# Patient Record
Sex: Male | Born: 1960 | Race: White | Hispanic: No | Marital: Married | Smoking: Never smoker
Health system: Southern US, Community
[De-identification: ages and names within clinical notes are randomized; demographics above are authoritative.]

## PROBLEM LIST (undated history)

## (undated) DIAGNOSIS — E785 Hyperlipidemia, unspecified: Secondary | ICD-10-CM

## (undated) DIAGNOSIS — H269 Unspecified cataract: Secondary | ICD-10-CM

## (undated) DIAGNOSIS — H3552 Pigmentary retinal dystrophy: Secondary | ICD-10-CM

## (undated) DIAGNOSIS — K5790 Diverticulosis of intestine, part unspecified, without perforation or abscess without bleeding: Secondary | ICD-10-CM

## (undated) DIAGNOSIS — J45909 Unspecified asthma, uncomplicated: Secondary | ICD-10-CM

## (undated) DIAGNOSIS — Z87442 Personal history of urinary calculi: Secondary | ICD-10-CM

## (undated) HISTORY — PX: CATARACT EXTRACTION: SUR2

## (undated) HISTORY — DX: Diverticulosis of intestine, part unspecified, without perforation or abscess without bleeding: K57.90

## (undated) HISTORY — PX: CLOSED MANIPULATION SHOULDER: SUR205

## (undated) HISTORY — DX: Pigmentary retinal dystrophy: H35.52

## (undated) HISTORY — DX: Hyperlipidemia, unspecified: E78.5

## (undated) HISTORY — PX: CHOLECYSTECTOMY: SHX55

## (undated) HISTORY — DX: Unspecified asthma, uncomplicated: J45.909

## (undated) HISTORY — DX: Unspecified cataract: H26.9

## (undated) HISTORY — PX: KNEE ARTHROSCOPY: SUR90

---

## 2019-08-24 ENCOUNTER — Encounter: Payer: Self-pay | Admitting: Gastroenterology

## 2019-09-25 ENCOUNTER — Ambulatory Visit (AMBULATORY_SURGERY_CENTER): Payer: Self-pay | Admitting: *Deleted

## 2019-09-25 ENCOUNTER — Other Ambulatory Visit: Payer: Self-pay

## 2019-09-25 VITALS — Temp 98.0°F | Ht 69.5 in | Wt 251.0 lb

## 2019-09-25 DIAGNOSIS — Z01818 Encounter for other preprocedural examination: Secondary | ICD-10-CM

## 2019-09-25 DIAGNOSIS — Z1211 Encounter for screening for malignant neoplasm of colon: Secondary | ICD-10-CM

## 2019-09-25 MED ORDER — NA SULFATE-K SULFATE-MG SULF 17.5-3.13-1.6 GM/177ML PO SOLN: 1.0000 | Freq: Once | ORAL | 0 refills | Status: AC

## 2019-09-25 NOTE — Progress Notes (Signed)
Pts wife in [place].  Temp 97.5  No egg or soy allergy known to patient  No issues with past sedation with any surgeries  or procedures, no intubation problems  No diet pills per patient No home 02 use per patient  No blood thinners per patient  Pt denies issues with constipation  No A fib or A flutter  EMMI video sent to pt's e mail   Due to the COVID-19 pandemic we are asking patients to follow these guidelines. Please only bring one care partner. Please be aware that your care partner may wait in the car in the parking lot or if they feel like they will be too hot to wait in the car, they may wait in the lobby on the 4th floor. All care partners are required to wear a mask the entire time (we do not have any that we can provide them), they need to practice social distancing, and we will do a Covid check for all patient's and care partners when you arrive. Also we will check their temperature and your temperature. If the care partner waits in their car they need to stay in the parking lot the entire time and we will call them on their cell phone when the patient is ready for discharge so they can bring the car to the front of the building. Also all patient's will need to wear a mask into building.

## 2019-10-05 ENCOUNTER — Other Ambulatory Visit (HOSPITAL_COMMUNITY)
Admission: RE | Admit: 2019-10-05 | Discharge: 2019-10-05 | Disposition: A | Discharge status: Home / Self Care | Payer: Medicare PPO | Source: Ambulatory Visit | Attending: Gastroenterology | Admitting: Gastroenterology

## 2019-10-05 ENCOUNTER — Other Ambulatory Visit: Payer: Self-pay

## 2019-10-05 DIAGNOSIS — Z20822 Contact with and (suspected) exposure to covid-19: Secondary | ICD-10-CM | POA: Diagnosis not present

## 2019-10-05 DIAGNOSIS — Z01812 Encounter for preprocedural laboratory examination: Secondary | ICD-10-CM | POA: Insufficient documentation

## 2019-10-06 LAB — SARS CORONAVIRUS 2 (TAT 6-24 HRS): SARS Coronavirus 2: NEGATIVE

## 2019-10-09 ENCOUNTER — Ambulatory Visit (AMBULATORY_SURGERY_CENTER): Payer: Medicare PPO | Admitting: Gastroenterology

## 2019-10-09 ENCOUNTER — Encounter: Payer: Self-pay | Admitting: Gastroenterology

## 2019-10-09 ENCOUNTER — Other Ambulatory Visit: Payer: Self-pay

## 2019-10-09 VITALS — BP 99/66 | HR 75 | Temp 96.9°F | Resp 19 | Ht 69.5 in | Wt 251.0 lb

## 2019-10-09 DIAGNOSIS — D123 Benign neoplasm of transverse colon: Secondary | ICD-10-CM

## 2019-10-09 DIAGNOSIS — Z1211 Encounter for screening for malignant neoplasm of colon: Secondary | ICD-10-CM

## 2019-10-09 HISTORY — PX: COLONOSCOPY: SHX174

## 2019-10-09 MED ORDER — SODIUM CHLORIDE 0.9 % IV SOLN: 500.0000 mL | Freq: Once | INTRAVENOUS | Status: DC

## 2019-10-09 NOTE — Op Note (Addendum)
[place] Patient Name: Austin Frost Procedure Date: 10/09/2019 9:16 AM MRN: [id number] Endoscopist: Ladene Artist , MD Age: 59 Referring MD:  Date of Birth: Sep 27, 1960 Gender: Male Account #: [id number] Procedure:                Colonoscopy Indications:              Screening for colorectal malignant neoplasm Medicines:                Monitored Anesthesia Care Procedure:                Pre-Anesthesia Assessment:                           - Prior to the procedure, a History and Physical                            was performed, and patient medications and                            allergies were reviewed. The patient's tolerance of                            previous anesthesia was also reviewed. The risks                            and benefits of the procedure and the sedation                            options and risks were discussed with the patient.                            All questions were answered, and informed consent                            was obtained. Prior Anticoagulants: The patient has                            taken no previous anticoagulant or antiplatelet                            agents. ASA Grade Assessment: II - A patient with                            mild systemic disease. After reviewing the risks                            and benefits, the patient was deemed in                            satisfactory condition to undergo the procedure.                           After obtaining informed consent, the colonoscope  was passed under direct vision. Throughout the                            procedure, the patient's blood pressure, pulse, and                            oxygen saturations were monitored continuously. The                            Colonoscope was introduced through the anus and                            advanced to the the cecum, identified by                            appendiceal orifice and  ileocecal valve. The                            ileocecal valve, appendiceal orifice, and rectum                            were photographed. The quality of the bowel                            preparation was good. The patient tolerated the                            procedure well. The colonoscopy was somewhat                            difficult due to multiple diverticula, spasm and                            stricture in the colon. Successful completion of                            the procedure was aided by withdrawing the adult                            colonoscope and replacing with the pediatric                            colonoscope. Scope In: 9:34:28 AM Scope Out: 9:56:55 AM Scope Withdrawal Time: 0 hours 18 minutes 41 seconds  Total Procedure Duration: 0 hours 22 minutes 27 seconds  Findings:                 The perianal and digital rectal examinations were                            normal.                           A 6 mm polyp was found in the transverse colon. The  polyp was sessile. The polyp was removed with a                            cold snare. Resection and retrieval were complete.                           Multiple medium-mouthed diverticula were found in                            the left colon. There was narrowing and stricturing                            of the colon in association with the diverticular                            opening. There was evidence of diverticular spasm                            and patchy erythema. There was no evidence of                            diverticular bleeding.                           Internal hemorrhoids were found during                            retroflexion. The hemorrhoids were small and Grade                            I (internal hemorrhoids that do not prolapse).                           The exam was otherwise without abnormality on                            direct and  retroflexion views. Complications:            No immediate complications. Estimated blood loss:                            None. Estimated Blood Loss:     Estimated blood loss: none. Impression:               - One 6 mm polyp in the transverse colon, removed                            with a cold snare. Resected and retrieved.                           - Severe diverticulosis in the left colon. There                            was narrowing and stricture of the colon in  association with the diverticular opening. There                            was evidence of diverticular spasm and erythema.                            There was no evidence of diverticular bleeding.                           - Internal hemorrhoids.                           - The examination was otherwise normal on direct                            and retroflexion views. Recommendation:           - Repeat colonoscopy after studies are complete for                            surveillance based on pathology results.                           - Patient has a contact number available for                            emergencies. The signs and symptoms of potential                            delayed complications were discussed with the                            patient. Return to normal activities tomorrow.                            Written discharge instructions were provided to the                            patient.                           - High fiber diet wtih adequate daily water intake.                           - Continue present medications.                           - Await pathology results. Ladene Artist, MD 10/09/2019 10:03:05 AM This report has been signed electronically.

## 2019-10-09 NOTE — Progress Notes (Signed)
Called to room to assist during endoscopic procedure.  Patient ID and intended procedure confirmed with present staff. Received instructions for my participation in the procedure from the performing physician.  

## 2019-10-09 NOTE — Progress Notes (Signed)
pt tolerated well. VSS. awake and to recovery. Report given to RN.  

## 2019-10-09 NOTE — Patient Instructions (Signed)
Handouts provided on polyps, diverticulosis, hemorrhoids and high-fiber diet.   Recommend a High-fiber diet with adequate daily water intake.   YOU HAD AN ENDOSCOPIC PROCEDURE TODAY AT [facility]:   Refer to the procedure report that was given to you for any specific questions about what was found during the examination.  If the procedure report does not answer your questions, please call your gastroenterologist to clarify.  If you requested that your care partner not be given the details of your procedure findings, then the procedure report has been included in a sealed envelope for you to review at your convenience later.  YOU SHOULD EXPECT: Some feelings of bloating in the abdomen. Passage of more gas than usual.  Walking can help get rid of the air that was put into your GI tract during the procedure and reduce the bloating. If you had a lower endoscopy (such as a colonoscopy or flexible sigmoidoscopy) you may notice spotting of blood in your stool or on the toilet paper. If you underwent a bowel prep for your procedure, you may not have a normal bowel movement for a few days.  Please Note:  You might notice some irritation and congestion in your nose or some drainage.  This is from the oxygen used during your procedure.  There is no need for concern and it should clear up in a day or so.  SYMPTOMS TO REPORT IMMEDIATELY:   Following lower endoscopy (colonoscopy or flexible sigmoidoscopy):  Excessive amounts of blood in the stool  Significant tenderness or worsening of abdominal pains  Swelling of the abdomen that is new, acute  Fever of 100F or higher   For urgent or emergent issues, a gastroenterologist can be reached at any hour by calling [phone]. Do not use MyChart messaging for urgent concerns.    DIET:  We do recommend a small meal at first, but then you may proceed to your regular diet.  Drink plenty of fluids but you should avoid alcoholic beverages for  24 hours.  ACTIVITY:  You should plan to take it easy for the rest of today and you should NOT DRIVE or use heavy machinery until tomorrow (because of the sedation medicines used during the test).    FOLLOW UP: Our staff will call the number listed on your records 48-72 hours following your procedure to check on you and address any questions or concerns that you may have regarding the information given to you following your procedure. If we do not reach you, we will leave a message.  We will attempt to reach you two times.  During this call, we will ask if you have developed any symptoms of COVID 19. If you develop any symptoms (ie: fever, flu-like symptoms, shortness of breath, cough etc.) before then, please call [phone].  If you test positive for Covid 19 in the 2 weeks post procedure, please call and report this information to [place].    If any biopsies were taken you will be contacted by phone or by letter within the next 1-3 weeks.  Please call us at [phone] if you have not heard about the biopsies in 3 weeks.    SIGNATURES/CONFIDENTIALITY: You and/or your care partner have signed paperwork which will be entered into your electronic medical record.  These signatures attest to the fact that that the information above on your After Visit Summary has been reviewed and is understood.  Full responsibility of the confidentiality of this discharge information lies with  you and/or your care-partner.

## 2019-10-09 NOTE — Progress Notes (Signed)
Vitals-cw Temp-JB  Pt's states no medical or surgical changes since previsit or office visit.

## 2019-10-11 ENCOUNTER — Telehealth: Payer: Self-pay

## 2019-10-11 NOTE — Telephone Encounter (Signed)
  Follow up Call-  Call back number 10/09/2019  Post procedure Call Back phone  # [phone]  Permission to leave phone message Yes     Patient questions:  Do you have a fever, pain , or abdominal swelling? No. Pain Score  0 *  Have you tolerated food without any problems? Yes.    Have you been able to return to your normal activities? Yes.    Do you have any questions about your discharge instructions: Diet   No. Medications  No. Follow up visit  No.  Do you have questions or concerns about your Care? No.  Actions: * If pain score is 4 or above: No action needed, pain <4.  1. Have you developed a fever since your procedure? no  2.   Have you had an respiratory symptoms (SOB or cough) since your procedure? no  3.   Have you tested positive for COVID 19 since your procedure no  4.   Have you had any family members/close contacts diagnosed with the COVID 19 since your procedure?  no   If yes to any of these questions please route to Joylene John, RN and Erenest Rasher, RN

## 2019-10-17 ENCOUNTER — Encounter: Payer: Self-pay | Admitting: Gastroenterology

## 2019-11-15 ENCOUNTER — Ambulatory Visit: Payer: Medicare PPO | Admitting: Urology

## 2019-11-22 ENCOUNTER — Ambulatory Visit: Payer: Medicare PPO | Admitting: Urology

## 2019-12-20 ENCOUNTER — Other Ambulatory Visit: Payer: Self-pay | Admitting: Urology

## 2019-12-20 ENCOUNTER — Ambulatory Visit: Payer: Medicare PPO | Admitting: Urology

## 2019-12-20 ENCOUNTER — Ambulatory Visit (INDEPENDENT_AMBULATORY_CARE_PROVIDER_SITE_OTHER): Payer: Medicare PPO | Admitting: Urology

## 2019-12-20 ENCOUNTER — Encounter: Payer: Self-pay | Admitting: Urology

## 2019-12-20 ENCOUNTER — Other Ambulatory Visit: Payer: Self-pay

## 2019-12-20 VITALS — BP 142/87 | HR 86 | Temp 97.0°F | Ht 70.0 in | Wt 250.0 lb

## 2019-12-20 DIAGNOSIS — R319 Hematuria, unspecified: Secondary | ICD-10-CM

## 2019-12-20 LAB — POCT URINALYSIS DIPSTICK
Glucose, UA: NEGATIVE
Ketones, UA: NEGATIVE
Leukocytes, UA: NEGATIVE
Nitrite, UA: NEGATIVE
Protein, UA: NEGATIVE
Spec Grav, UA: >=1.03 — AB (ref 1.010–1.025)
Urobilinogen, UA: NEGATIVE E.U./dL — AB
pH, UA: 5 (ref 5.0–8.0)

## 2019-12-20 LAB — BASIC METABOLIC PANEL
BUN: 14 mg/dL (ref 7–25)
CO2: 29 mmol/L (ref 20–32)
Calcium: 9.8 mg/dL (ref 8.6–10.3)
Chloride: 101 mmol/L (ref 98–110)
Creat: 0.79 mg/dL (ref 0.70–1.33)
Glucose, Bld: 103 mg/dL (ref 65–139)
Potassium: 4.6 mmol/L (ref 3.5–5.3)
Sodium: 138 mmol/L (ref 135–146)

## 2019-12-20 NOTE — Progress Notes (Signed)
12/20/2019 9:10 AM   Austin Frost July 13, 1960 [id number]  Referring provider: Josem Kaufmann, MD [address] [place],  [place] [address]  Microhematuria  HPI: Austin Frost is a 59yo here for evaluation of microhematuria. UA today shows trace blood. He has had multiple UAs which showed microscopic blood. No hx of nephrolithiasis but he thinks he passed one last week. He was having right flank pain and dysuria which have since resolved. No tobacco abuse hx. 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 No significant LUTS. No prior UTIs. No blood thinners.    PMH: Past Medical History:  Diagnosis Date  . Asthma    as a kid  . Cataract   . Diverticulosis   . Hyperlipidemia   . Retinitis pigmentosa     Surgical History: Past Surgical History:  Procedure Laterality Date  . CATARACT EXTRACTION    . CHOLECYSTECTOMY    . CLOSED MANIPULATION SHOULDER Left   . COLONOSCOPY  10/09/2019  . KNEE ARTHROSCOPY Left     Home Medications:  Allergies as of 12/20/2019   No Known Allergies     Medication List       Accurate as of December 20, 2019  9:10 AM. If you have any questions, ask your nurse or doctor.        Align 4 MG Caps Take by mouth.   rosuvastatin 10 MG tablet Commonly known as: CRESTOR Take 10 mg by mouth daily.   VITAMIN A PO Take by mouth.       Allergies: No Known Allergies  Family History: Family History  Problem Relation Age of Onset  . Colon cancer Father 27  . Esophageal cancer Neg Hx   . Stomach cancer Neg Hx   . Rectal cancer Neg Hx     Social History:  reports that he has never smoked. He has never used smokeless tobacco. He reports that he does not drink alcohol and does not use drugs.  ROS: All other review of systems were reviewed and are negative except what is noted above in HPI  Physical Exam: BP (!) 142/87   Pulse 86   Temp (!) 97 F (36.1 C)   Ht 5\' 10"  (1.778 m)   Wt 250 lb (113.4 kg)   BMI 35.87 kg/m   Constitutional:  Alert and  oriented, No acute distress. HEENT: [place] AT, moist mucus membranes.  Trachea midline, no masses. Cardiovascular: No clubbing, cyanosis, or edema. Respiratory: Normal respiratory effort, no increased work of breathing. GI: Abdomen is soft, nontender, nondistended, no abdominal masses GU: No CVA tenderness. Circumcised phallus. No masses/lesions on penis, testis, scrotum. Prostate 40g smooth no nodules no induration.  Lymph: No cervical or inguinal lymphadenopathy. Skin: No rashes, bruises or suspicious lesions. Neurologic: Grossly intact, no focal deficits, moving all 4 extremities. Psychiatric: Normal mood and affect.  Laboratory Data: No results found for: WBC, HGB, HCT, MCV, PLT  No results found for: CREATININE  No results found for: PSA  No results found for: TESTOSTERONE  No results found for: HGBA1C  Urinalysis    Component Value Date/Time   BILIRUBINUR small 12/20/2019 0852   PROTEINUR Negative 12/20/2019 0852   UROBILINOGEN negative (A) 12/20/2019 0852   NITRITE neg 12/20/2019 0852   LEUKOCYTESUR Negative 12/20/2019 0852    No results found for: LABMICR, [place], RBCUA, LABEPIT, MUCUS, BACTERIA  Pertinent Imaging:  No results found for this or any previous visit.  No results found for this or any previous visit.  No results  found for this or any previous visit.  No results found for this or any previous visit.  No results found for this or any previous visit.  No results found for this or any previous visit.  No results found for this or any previous visit.  No results found for this or any previous visit.   Assessment & Plan:    1. Hematuria, microhematuria -BMP -CT hematuria -off cystoscopy - POCT urinalysis dipstick   No follow-ups on file.  Nicolette Bang, MD  [facility]

## 2019-12-20 NOTE — Progress Notes (Signed)
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

## 2019-12-20 NOTE — Patient Instructions (Signed)
Hematuria, Adult Hematuria is blood in the urine. Blood may be visible in the urine, or it may be identified with a test. This condition can be caused by infections of the bladder, urethra, kidney, or prostate. Other possible causes include:  Kidney stones.  Cancer of the urinary tract.  Too much calcium in the urine.  Conditions that are passed from parent to child (inherited conditions).  Exercise that requires a lot of energy. Infections can usually be treated with medicine, and a kidney stone usually will pass through your urine. If neither of these is the cause of your hematuria, more tests may be needed to identify the cause of your symptoms. It is very important to tell your health care provider about any blood in your urine, even if it is painless or the blood stops without treatment. Blood in the urine, when it happens and then stops and then happens again, can be a symptom of a very serious condition, including cancer. There is no pain in the initial stages of many urinary cancers. Follow these instructions at home: Medicines  Take over-the-counter and prescription medicines only as told by your health care provider.  If you were prescribed an antibiotic medicine, take it as told by your health care provider. Do not stop taking the antibiotic even if you start to feel better. Eating and drinking  Drink enough fluid to keep your urine clear or pale yellow. It is recommended that you drink 3-4 quarts (2.8-3.8 L) a day. If you have been diagnosed with an infection, it is recommended that you drink cranberry juice in addition to large amounts of water.  Avoid caffeine, tea, and carbonated beverages. These tend to irritate the bladder.  Avoid alcohol because it may irritate the prostate (men). General instructions  If you have been diagnosed with a kidney stone, follow your health care provider's instructions about straining your urine to catch the stone.  Empty your bladder  often. Avoid holding urine for long periods of time.  If you are male: ? After a bowel movement, wipe from front to back and use each piece of toilet paper only once. ? Empty your bladder before and after sex.  Pay attention to any changes in your symptoms. Tell your health care provider about any changes or any new symptoms.  It is your responsibility to get your test results. Ask your health care provider, or the department performing the test, when your results will be ready.  Keep all follow-up visits as told by your health care provider. This is important. Contact a health care provider if:  You develop back pain.  You have a fever.  You have nausea or vomiting.  Your symptoms do not improve after 3 days.  Your symptoms get worse. Get help right away if:  You develop severe vomiting and are unable take medicine without vomiting.  You develop severe pain in your back or abdomen even though you are taking medicine.  You pass a large amount of blood in your urine.  You pass blood clots in your urine.  You feel very weak or like you might faint.  You faint. Summary  Hematuria is blood in the urine. It has many possible causes.  It is very important that you tell your health care provider about any blood in your urine, even if it is painless or the blood stops without treatment.  Take over-the-counter and prescription medicines only as told by your health care provider.  Drink enough fluid to keep   your urine clear or pale yellow. This information is not intended to replace advice given to you by your health care provider. Make sure you discuss any questions you have with your health care provider. Document Revised: 11/02/2018 Document Reviewed: 07/11/2016 Elsevier Patient Education  2020 [employer] Inc[employer]  

## 2019-12-21 ENCOUNTER — Encounter: Payer: Self-pay | Admitting: Urology

## 2019-12-29 NOTE — Progress Notes (Signed)
Results sent via my chart 

## 2020-01-12 ENCOUNTER — Other Ambulatory Visit: Payer: Self-pay

## 2020-01-12 ENCOUNTER — Encounter (HOSPITAL_COMMUNITY): Payer: Self-pay

## 2020-01-12 ENCOUNTER — Ambulatory Visit (HOSPITAL_COMMUNITY)
Admission: RE | Admit: 2020-01-12 | Discharge: 2020-01-12 | Disposition: A | Discharge status: Home / Self Care | Payer: Medicare PPO | Source: Ambulatory Visit | Attending: Urology | Admitting: Urology

## 2020-01-12 DIAGNOSIS — R319 Hematuria, unspecified: Secondary | ICD-10-CM | POA: Diagnosis not present

## 2020-01-12 MED ORDER — IOHEXOL 300 MG/ML  SOLN
125.0000 mL | Freq: Once | INTRAMUSCULAR | Status: AC | PRN
  Administered 2020-01-12: 125 mL via INTRAVENOUS

## 2020-01-17 ENCOUNTER — Ambulatory Visit (INDEPENDENT_AMBULATORY_CARE_PROVIDER_SITE_OTHER): Payer: Medicare PPO | Admitting: Urology

## 2020-01-17 ENCOUNTER — Other Ambulatory Visit: Payer: Self-pay

## 2020-01-17 ENCOUNTER — Encounter: Payer: Self-pay | Admitting: Urology

## 2020-01-17 VITALS — BP 139/83 | HR 83 | Temp 99.0°F | Ht 70.0 in | Wt 250.0 lb

## 2020-01-17 DIAGNOSIS — N200 Calculus of kidney: Secondary | ICD-10-CM

## 2020-01-17 DIAGNOSIS — R319 Hematuria, unspecified: Secondary | ICD-10-CM | POA: Diagnosis not present

## 2020-01-17 MED ORDER — CIPROFLOXACIN HCL 500 MG PO TABS
500.0000 mg | ORAL_TABLET | Freq: Once | ORAL | Status: AC
  Administered 2020-01-17: 500 mg via ORAL

## 2020-01-17 NOTE — H&P (View-Only) (Signed)
   01/17/20  CC: microhematuria  HPI: Mr Younge is a 59yo here for followup for microhematuria. CT showeda  31mm left mid pole calculus. It also showed evidence of chronic diverticulitis and patient is currently being seen by GI in [place].   Blood pressure (!) 139/83, pulse 83, temperature 99 F (37.2 C), height 5\' 10"  (1.778 m), weight (!) 250 lb (113.4 kg). NED. A&Ox3.   No respiratory distress   Abd soft, NT, ND Normal phallus with bilateral descended testicles  Cystoscopy Procedure Note  Patient identification was confirmed, informed consent was obtained, and patient was prepped using Betadine solution.  Lidocaine jelly was administered per urethral meatus.     Pre-Procedure: - Inspection reveals a normal caliber ureteral meatus.  Procedure: The flexible cystoscope was introduced without difficulty - No urethral strictures/lesions are present. - Enlarged prostate  - Normal bladder neck - Bilateral ureteral orifices identified - Bladder mucosa  reveals no ulcers, tumors, or lesions - No bladder stones - No trabeculation  Retroflexion shows 1cm intravesical prostatic protrusion   Post-Procedure: - Patient tolerated the procedure well  Assessment/ Plan: 1. Left nephrolithiasis -We discussed the management of kidney stones. These options include observation, ureteroscopy, shockwave lithotripsy (ESWL) and percutaneous nephrolithotomy (PCNL). We discussed which options are relevant to the patient's stone(s). We discussed the natural history of kidney stones as well as the complications of untreated stones and the impact on quality of life without treatment as well as with each of the above listed treatments. We also discussed the efficacy of each treatment in its ability to clear the stone burden. With any of these management options I discussed the signs and symptoms of infection and the need for emergent treatment should these be experienced. For each option we discussed the  ability of each procedure to clear the patient of their stone burden.   For observation I described the risks which include but are not limited to silent renal damage, life-threatening infection, need for emergent surgery, failure to pass stone and pain.   For ureteroscopy I described the risks which include bleeding, infection, damage to contiguous structures, positioning injury, ureteral stricture, ureteral avulsion, ureteral injury, need for prolonged ureteral stent, inability to perform ureteroscopy, need for an interval procedure, inability to clear stone burden, stent discomfort/pain, heart attack, stroke, pulmonary embolus and the inherent risks with general anesthesia.   For shockwave lithotripsy I described the risks which include arrhythmia, kidney contusion, kidney hemorrhage, need for transfusion, pain, inability to adequately break up stone, inability to pass stone fragments, [place], infection associated with obstructing stones, need for alternate surgical procedure, need for repeat shockwave lithotripsy, MI, CVA, PE and the inherent risks with anesthesia/conscious sedation.   For PCNL I described the risks including positioning injury, pneumothorax, hydrothorax, need for chest tube, inability to clear stone burden, renal laceration, arterial venous fistula or malformation, need for embolization of kidney, loss of kidney or renal function, need for repeat procedure, need for prolonged nephrostomy tube, ureteral avulsion, MI, CVA, PE and the inherent risks of general anesthesia.   - The patient would like to proceed with ESWL  No follow-ups on file.  Nicolette Bang, MD

## 2020-01-17 NOTE — Patient Instructions (Signed)
Dietary Guidelines to Help Prevent Kidney Stones Kidney stones are deposits of minerals and salts that form inside your kidneys. Your risk of developing kidney stones may be greater depending on your diet, your lifestyle, the medicines you take, and whether you have certain medical conditions. Most people can reduce their chances of developing kidney stones by following the instructions below. Depending on your overall health and the type of kidney stones you tend to develop, your dietitian may give you more specific instructions. What are tips for following this plan? Reading food labels  Choose foods with "no salt added" or "low-salt" labels. Limit your sodium intake to less than 1500 mg per day.  Choose foods with calcium for each meal and snack. Try to eat about 300 mg of calcium at each meal. Foods that contain 200-500 mg of calcium per serving include: ? 8 oz (237 ml) of milk, fortified nondairy milk, and fortified fruit juice. ? 8 oz (237 ml) of kefir, yogurt, and soy yogurt. ? 4 oz (118 ml) of tofu. ? 1 oz of cheese. ? 1 cup (300 g) of dried figs. ? 1 cup (91 g) of cooked broccoli. ? 1-3 oz can of sardines or mackerel.  Most people need 1000 to 1500 mg of calcium each day. Talk to your dietitian about how much calcium is recommended for you. Shopping  Buy plenty of fresh fruits and vegetables. Most people do not need to avoid fruits and vegetables, even if they contain nutrients that may contribute to kidney stones.  When shopping for convenience foods, choose: ? Whole pieces of fruit. ? Premade salads with dressing on the side. ? Low-fat fruit and yogurt smoothies.  Avoid buying frozen meals or prepared deli foods.  Look for foods with live cultures, such as yogurt and kefir. Cooking  Do not add salt to food when cooking. Place a salt shaker on the table and allow each person to add his or her own salt to taste.  Use vegetable protein, such as beans, textured vegetable  protein (TVP), or tofu instead of meat in pasta, casseroles, and soups. Meal planning   Eat less salt, if told by your dietitian. To do this: ? Avoid eating processed or premade food. ? Avoid eating fast food.  Eat less animal protein, including cheese, meat, poultry, or fish, if told by your dietitian. To do this: ? Limit the number of times you have meat, poultry, fish, or cheese each week. Eat a diet free of meat at least 2 days a week. ? Eat only one serving each day of meat, poultry, fish, or seafood. ? When you prepare animal protein, cut pieces into small portion sizes. For most meat and fish, one serving is about the size of one deck of cards.  Eat at least 5 servings of fresh fruits and vegetables each day. To do this: ? Keep fruits and vegetables on hand for snacks. ? Eat 1 piece of fruit or a handful of berries with breakfast. ? Have a salad and fruit at lunch. ? Have two kinds of vegetables at dinner.  Limit foods that are high in a substance called oxalate. These include: ? Spinach. ? Rhubarb. ? Beets. ? Potato chips and french fries. ? Nuts.  If you regularly take a diuretic medicine, make sure to eat at least 1-2 fruits or vegetables high in potassium each day. These include: ? Avocado. ? Banana. ? Orange, prune, carrot, or tomato juice. ? Baked potato. ? Cabbage. ? Beans and split   peas. General instructions   Drink enough fluid to keep your urine clear or pale yellow. This is the most important thing you can do.  Talk to your health care provider and dietitian about taking daily supplements. Depending on your health and the cause of your kidney stones, you may be advised: ? Not to take supplements with vitamin C. ? To take a calcium supplement. ? To take a daily probiotic supplement. ? To take other supplements such as magnesium, fish oil, or vitamin B6.  Take all medicines and supplements as told by your health care provider.  Limit alcohol intake to no  more than 1 drink a day for nonpregnant women and 2 drinks a day for men. One drink equals 12 oz of beer, 5 oz of wine, or 1 oz of hard liquor.  Lose weight if told by your health care provider. Work with your dietitian to find strategies and an eating plan that works best for you. What foods are not recommended? Limit your intake of the following foods, or as told by your dietitian. Talk to your dietitian about specific foods you should avoid based on the type of kidney stones and your overall health. Grains Breads. Bagels. Rolls. Baked goods. Salted crackers. Cereal. Pasta. Vegetables Spinach. Rhubarb. Beets. Canned vegetables. Pickles. Olives. Meats and other protein foods Nuts. Nut butters. Large portions of meat, poultry, or fish. Salted or cured meats. Deli meats. Hot dogs. Sausages. Dairy Cheese. Beverages Regular soft drinks. Regular vegetable juice. Seasonings and other foods Seasoning blends with salt. Salad dressings. Canned soups. Soy sauce. Ketchup. Barbecue sauce. Canned pasta sauce. Casseroles. Pizza. Lasagna. Frozen meals. Potato chips. French fries. Summary  You can reduce your risk of kidney stones by making changes to your diet.  The most important thing you can do is drink enough fluid. You should drink enough fluid to keep your urine clear or pale yellow.  Ask your health care provider or dietitian how much protein from animal sources you should eat each day, and also how much salt and calcium you should have each day. This information is not intended to replace advice given to you by your health care provider. Make sure you discuss any questions you have with your health care provider. Document Revised: 09/28/2018 Document Reviewed: 05/19/2016 Elsevier Patient Education  2020 [employer]  

## 2020-01-17 NOTE — Progress Notes (Signed)
   01/17/20  CC: microhematuria  HPI: Mr Bartoli is a 59yo here for followup for microhematuria. CT showeda  62mm left mid pole calculus. It also showed evidence of chronic diverticulitis and patient is currently being seen by GI in [place].   Blood pressure (!) 139/83, pulse 83, temperature 99 F (37.2 C), height 5\' 10"  (1.778 m), weight (!) 250 lb (113.4 kg). NED. A&Ox3.   No respiratory distress   Abd soft, NT, ND Normal phallus with bilateral descended testicles  Cystoscopy Procedure Note  Patient identification was confirmed, informed consent was obtained, and patient was prepped using Betadine solution.  Lidocaine jelly was administered per urethral meatus.     Pre-Procedure: - Inspection reveals a normal caliber ureteral meatus.  Procedure: The flexible cystoscope was introduced without difficulty - No urethral strictures/lesions are present. - Enlarged prostate  - Normal bladder neck - Bilateral ureteral orifices identified - Bladder mucosa  reveals no ulcers, tumors, or lesions - No bladder stones - No trabeculation  Retroflexion shows 1cm intravesical prostatic protrusion   Post-Procedure: - Patient tolerated the procedure well  Assessment/ Plan: 1. Left nephrolithiasis -We discussed the management of kidney stones. These options include observation, ureteroscopy, shockwave lithotripsy (ESWL) and percutaneous nephrolithotomy (PCNL). We discussed which options are relevant to the patient's stone(s). We discussed the natural history of kidney stones as well as the complications of untreated stones and the impact on quality of life without treatment as well as with each of the above listed treatments. We also discussed the efficacy of each treatment in its ability to clear the stone burden. With any of these management options I discussed the signs and symptoms of infection and the need for emergent treatment should these be experienced. For each option we discussed the  ability of each procedure to clear the patient of their stone burden.   For observation I described the risks which include but are not limited to silent renal damage, life-threatening infection, need for emergent surgery, failure to pass stone and pain.   For ureteroscopy I described the risks which include bleeding, infection, damage to contiguous structures, positioning injury, ureteral stricture, ureteral avulsion, ureteral injury, need for prolonged ureteral stent, inability to perform ureteroscopy, need for an interval procedure, inability to clear stone burden, stent discomfort/pain, heart attack, stroke, pulmonary embolus and the inherent risks with general anesthesia.   For shockwave lithotripsy I described the risks which include arrhythmia, kidney contusion, kidney hemorrhage, need for transfusion, pain, inability to adequately break up stone, inability to pass stone fragments, Steinstrasse, infection associated with obstructing stones, need for alternate surgical procedure, need for repeat shockwave lithotripsy, MI, CVA, PE and the inherent risks with anesthesia/conscious sedation.   For PCNL I described the risks including positioning injury, pneumothorax, hydrothorax, need for chest tube, inability to clear stone burden, renal laceration, arterial venous fistula or malformation, need for embolization of kidney, loss of kidney or renal function, need for repeat procedure, need for prolonged nephrostomy tube, ureteral avulsion, MI, CVA, PE and the inherent risks of general anesthesia.   - The patient would like to proceed with ESWL  No follow-ups on file.  Nicolette Bang, MD

## 2020-01-17 NOTE — Progress Notes (Signed)
Urological Symptom Review  Patient is experiencing the following symptoms: Frequent urination Get up at night to urinate Blood in urine Kidney stones  Review of Systems  Gastrointestinal (upper)  : Negative for upper GI symptoms  Gastrointestinal (lower) : Diarrhea  Constitutional : Negative for symptoms  Skin: Negative for skin symptoms  Eyes: Negative for eye symptoms  Ear/Nose/Throat : Negative for Ear/Nose/Throat symptoms  Hematologic/Lymphatic: Negative for Hematologic/Lymphatic symptoms  Cardiovascular : Negative for cardiovascular symptoms  Respiratory : Negative for respiratory symptoms  Endocrine: Negative for endocrine symptoms  Musculoskeletal: Negative for musculoskeletal symptoms  Neurological: Negative for neurological symptoms  Psychologic: Negative for psychiatric symptoms

## 2020-01-18 LAB — URINALYSIS, ROUTINE W REFLEX MICROSCOPIC
Bilirubin, UA: NEGATIVE
Glucose, UA: NEGATIVE
Ketones, UA: NEGATIVE
Leukocytes,UA: NEGATIVE
Nitrite, UA: NEGATIVE
Protein,UA: NEGATIVE
Specific Gravity, UA: >1.03 — ABNORMAL HIGH (ref 1.005–1.030)
Urobilinogen, Ur: 0.2 mg/dL (ref 0.2–1.0)
pH, UA: 5 (ref 5.0–7.5)

## 2020-01-18 LAB — MICROSCOPIC EXAMINATION
Bacteria, UA: NONE SEEN
Epithelial Cells (non renal): NONE SEEN /hpf (ref 0–10)
Renal Epithel, UA: NONE SEEN /hpf
WBC, UA: NONE SEEN /hpf (ref 0–5)

## 2020-01-23 ENCOUNTER — Other Ambulatory Visit: Payer: Self-pay

## 2020-01-23 ENCOUNTER — Encounter (HOSPITAL_COMMUNITY)
Admission: RE | Admit: 2020-01-23 | Discharge: 2020-01-23 | Disposition: A | Discharge status: Home / Self Care | Payer: Medicare PPO | Source: Ambulatory Visit | Attending: Urology | Admitting: Urology

## 2020-01-23 ENCOUNTER — Encounter (HOSPITAL_COMMUNITY): Payer: Self-pay

## 2020-01-23 HISTORY — DX: Personal history of urinary calculi: Z87.442

## 2020-01-29 ENCOUNTER — Other Ambulatory Visit (HOSPITAL_COMMUNITY)
Admission: RE | Admit: 2020-01-29 | Discharge: 2020-01-29 | Disposition: A | Discharge status: Home / Self Care | Payer: Medicare PPO | Source: Ambulatory Visit | Attending: Urology | Admitting: Urology

## 2020-01-29 ENCOUNTER — Other Ambulatory Visit: Payer: Self-pay

## 2020-01-29 DIAGNOSIS — Z01812 Encounter for preprocedural laboratory examination: Secondary | ICD-10-CM | POA: Insufficient documentation

## 2020-01-29 DIAGNOSIS — Z20822 Contact with and (suspected) exposure to covid-19: Secondary | ICD-10-CM | POA: Diagnosis not present

## 2020-01-29 LAB — SARS CORONAVIRUS 2 (TAT 6-24 HRS): SARS Coronavirus 2: NEGATIVE

## 2020-01-30 ENCOUNTER — Ambulatory Visit (HOSPITAL_COMMUNITY)
Admission: RE | Admit: 2020-01-30 | Discharge: 2020-01-30 | Disposition: A | Discharge status: Home / Self Care | Payer: Medicare PPO | Attending: Urology | Admitting: Urology

## 2020-01-30 ENCOUNTER — Ambulatory Visit (HOSPITAL_COMMUNITY)
Admission: RE | Admit: 2020-01-30 | Discharge: 2020-01-30 | Disposition: A | Discharge status: Home / Self Care | Payer: Medicare PPO | Source: Home / Self Care | Attending: Urology | Admitting: Urology

## 2020-01-30 ENCOUNTER — Encounter (HOSPITAL_COMMUNITY)
Admission: RE | Disposition: A | Discharge status: Home / Self Care | Payer: Self-pay | Source: Home / Self Care | Attending: Urology

## 2020-01-30 DIAGNOSIS — E669 Obesity, unspecified: Secondary | ICD-10-CM | POA: Diagnosis not present

## 2020-01-30 DIAGNOSIS — N200 Calculus of kidney: Secondary | ICD-10-CM | POA: Insufficient documentation

## 2020-01-30 DIAGNOSIS — Z6835 Body mass index (BMI) 35.0-35.9, adult: Secondary | ICD-10-CM | POA: Diagnosis not present

## 2020-01-30 HISTORY — PX: EXTRACORPOREAL SHOCK WAVE LITHOTRIPSY: SHX1557

## 2020-01-30 SURGERY — LITHOTRIPSY, ESWL
Anesthesia: LOCAL | Laterality: Left

## 2020-01-30 MED ORDER — DIPHENHYDRAMINE HCL 25 MG PO CAPS
ORAL_CAPSULE | ORAL | Status: AC
  Filled 2020-01-30: qty 1

## 2020-01-30 MED ORDER — DIAZEPAM 5 MG PO TABS
10.0000 mg | ORAL_TABLET | Freq: Once | ORAL | Status: AC
  Administered 2020-01-30: 10 mg via ORAL
  Filled 2020-01-30 (×2): qty 2

## 2020-01-30 MED ORDER — SODIUM CHLORIDE 0.9 % IV SOLN: Freq: Once | INTRAVENOUS | Status: AC

## 2020-01-30 MED ORDER — OXYCODONE-ACETAMINOPHEN 5-325 MG PO TABS: 1.0000 | ORAL_TABLET | ORAL | 0 refills | Status: AC | PRN

## 2020-01-30 MED ORDER — TAMSULOSIN HCL 0.4 MG PO CAPS: 0.4000 mg | ORAL_CAPSULE | Freq: Every day | ORAL | 0 refills | Status: DC

## 2020-01-30 MED ORDER — DIPHENHYDRAMINE HCL 25 MG PO CAPS
25.0000 mg | ORAL_CAPSULE | ORAL | Status: AC
  Administered 2020-01-30: 25 mg via ORAL

## 2020-01-30 NOTE — Discharge Instructions (Signed)
Lithotripsy, Care After This sheet gives you information about how to care for yourself after your procedure. Your health care provider may also give you more specific instructions. If you have problems or questions, contact your health care provider. What can I expect after the procedure? After the procedure, it is common to have:  Some blood in your urine. This should only last for a few days.  Soreness in your back, sides, or upper abdomen for a few days.  Blotches or bruises on your back where the pressure wave entered the skin.  Pain, discomfort, or nausea when pieces (fragments) of the kidney stone move through the tube that carries urine from the kidney to the bladder (ureter). Stone fragments may pass soon after the procedure, but they may continue to pass for up to 4-8 weeks. ? If you have severe pain or nausea, contact your health care provider. This may be caused by a large stone that was not broken up, and this may mean that you need more treatment.  Some pain or discomfort during urination.  Some pain or discomfort in the lower abdomen or (in men) at the base of the penis. Follow these instructions at home: Medicines  Take over-the-counter and prescription medicines only as told by your health care provider.  If you were prescribed an antibiotic medicine, take it as told by your health care provider. Do not stop taking the antibiotic even if you start to feel better.  Do not drive for 24 hours if you were given a medicine to help you relax (sedative).  Do not drive or use heavy machinery while taking prescription pain medicine. Eating and drinking      Drink enough water and fluids to keep your urine clear or pale yellow. This helps any remaining pieces of the stone to pass. It can also help prevent new stones from forming.  Eat plenty of fresh fruits and vegetables.  Follow instructions from your health care provider about eating and drinking restrictions. You may be  instructed: ? To reduce how much salt (sodium) you eat or drink. Check ingredients and nutrition facts on packaged foods and beverages. ? To reduce how much meat you eat.  Eat the recommended amount of calcium for your age and gender. Ask your health care provider how much calcium you should have. General instructions  Get plenty of rest.  Most people can resume normal activities 1-2 days after the procedure. Ask your health care provider what activities are safe for you.  Your health care provider may direct you to lie in a certain position (postural drainage) and tap firmly (percuss) over your kidney area to help stone fragments pass. Follow instructions as told by your health care provider.  If directed, strain all urine through the strainer that was provided by your health care provider. ? Keep all fragments for your health care provider to see. Any stones that are found may be sent to a medical lab for examination. The stone may be as small as a grain of salt.  Keep all follow-up visits as told by your health care provider. This is important. Contact a health care provider if:  You have pain that is severe or does not get better with medicine.  You have nausea that is severe or does not go away.  You have blood in your urine longer than your health care provider told you to expect.  You have more blood in your urine.  You have pain during urination that does   urination that does not go away.  You urinate more frequently than usual and this does not go away.  You develop a rash or any other possible signs of an allergic reaction. Get help right away if:  You have severe pain in your back, sides, or upper abdomen.  You have severe pain while urinating.  Your urine is very dark red.  You have blood in your stool (feces).  You cannot pass any urine at all.  You feel a strong urge to urinate after emptying your bladder.  You have a fever or chills.  You develop shortness of  breath, difficulty breathing, or chest pain.  You have severe nausea that leads to persistent vomiting.  You faint. Summary  After this procedure, it is common to have some pain, discomfort, or nausea when pieces (fragments) of the kidney stone move through the tube that carries urine from the kidney to the bladder (ureter). If this pain or nausea is severe, however, you should contact your health care provider.  Most people can resume normal activities 1-2 days after the procedure. Ask your health care provider what activities are safe for you.  Drink enough water and fluids to keep your urine clear or pale yellow. This helps any remaining pieces of the stone to pass, and it can help prevent new stones from forming.  If directed, strain your urine and keep all fragments for your health care provider to see. Fragments or stones may be as small as a grain of salt.  Get help right away if you have severe pain in your back, sides, or upper abdomen or have severe pain while urinating. This information is not intended to replace advice given to you by your health care provider. Make sure you discuss any questions you have with your health care provider. Document Revised: 09/19/2018 Document Reviewed: 04/29/2016 Elsevier Patient Education  2020 [employer] Monitored Anesthesia Care, Care After These instructions provide you with information about caring for yourself after your procedure. Your health care provider may also give you more specific instructions. Your treatment has been planned according to current medical practices, but problems sometimes occur. Call your health care provider if you have any problems or questions after your procedure. What can I expect after the procedure? After your procedure, you may:  Feel sleepy for several hours.  Feel clumsy and have poor balance for several hours.  Feel forgetful about what happened after the procedure.  Have poor judgment for several  hours.  Feel nauseous or vomit.  Have a sore throat if you had a breathing tube during the procedure. Follow these instructions at home: For at least 24 hours after the procedure:        Have a responsible adult stay with you. It is important to have someone help care for you until you are awake and alert.  Rest as needed.  Do not: ? Participate in activities in which you could fall or become injured. ? Drive. ? Use heavy machinery. ? Drink alcohol. ? Take sleeping pills or medicines that cause drowsiness. ? Make important decisions or sign legal documents. ? Take care of children on your own. Eating and drinking  Follow the diet that is recommended by your health care provider.  If you vomit, drink water, juice, or soup when you can drink without vomiting.  Make sure you have little or no nausea before eating solid foods. General instructions  Take over-the-counter and prescription medicines only as told by your health   care provider.  If you have sleep apnea, surgery and certain medicines can increase your risk for breathing problems. Follow instructions from your health care provider about wearing your sleep device: ? Anytime you are sleeping, including during daytime naps. ? While taking prescription pain medicines, sleeping medicines, or medicines that make you drowsy.  If you smoke, do not smoke without supervision.  Keep all follow-up visits as told by your health care provider. This is important. Contact a health care provider if:  You keep feeling nauseous or you keep vomiting.  You feel light-headed.  You develop a rash.  You have a fever. Get help right away if:  You have trouble breathing. Summary  For several hours after your procedure, you may feel sleepy and have poor judgment.  Have a responsible adult stay with you for at least 24 hours or until you are awake and alert. This information is not intended to replace advice given to you by your  health care provider. Make sure you discuss any questions you have with your health care provider. Document Revised: 09/06/2017 Document Reviewed: 09/29/2015 Elsevier Patient Education  2020 [employer] Inc[employer]  

## 2020-01-30 NOTE — Progress Notes (Signed)
Left flank noted with erythema, no blistering.

## 2020-01-30 NOTE — Interval H&P Note (Signed)
History and Physical Interval Note:  01/30/2020 8:19 AM  Austin Frost  has presented today for surgery, with the diagnosis of left renal stone.  The various methods of treatment have been discussed with the patient and family. After consideration of risks, benefits and other options for treatment, the patient has consented to  Procedure(s): EXTRACORPOREAL SHOCK WAVE LITHOTRIPSY (ESWL) (Left) as a surgical intervention.  The patient's history has been reviewed, patient examined, no change in status, stable for surgery.  I have reviewed the patient's chart and labs.  Questions were answered to the patient's satisfaction.     Nicolette Bang

## 2020-02-05 ENCOUNTER — Other Ambulatory Visit: Payer: Self-pay

## 2020-02-05 DIAGNOSIS — N200 Calculus of kidney: Secondary | ICD-10-CM

## 2020-02-09 ENCOUNTER — Other Ambulatory Visit: Payer: Self-pay

## 2020-02-09 ENCOUNTER — Encounter (HOSPITAL_COMMUNITY): Payer: Self-pay | Admitting: Urology

## 2020-02-09 ENCOUNTER — Ambulatory Visit (HOSPITAL_COMMUNITY)
Admission: RE | Admit: 2020-02-09 | Discharge: 2020-02-09 | Disposition: A | Discharge status: Home / Self Care | Payer: Medicare PPO | Source: Ambulatory Visit | Attending: Urology | Admitting: Urology

## 2020-02-09 DIAGNOSIS — N200 Calculus of kidney: Secondary | ICD-10-CM | POA: Diagnosis present

## 2020-02-13 ENCOUNTER — Other Ambulatory Visit: Payer: Self-pay

## 2020-02-13 ENCOUNTER — Ambulatory Visit (INDEPENDENT_AMBULATORY_CARE_PROVIDER_SITE_OTHER): Payer: Medicare PPO | Admitting: Urology

## 2020-02-13 ENCOUNTER — Encounter: Payer: Self-pay | Admitting: Urology

## 2020-02-13 VITALS — BP 129/83 | HR 91 | Temp 98.4°F | Ht 70.0 in | Wt 250.0 lb

## 2020-02-13 DIAGNOSIS — N200 Calculus of kidney: Secondary | ICD-10-CM

## 2020-02-13 LAB — URINALYSIS, ROUTINE W REFLEX MICROSCOPIC
Bilirubin, UA: NEGATIVE
Glucose, UA: NEGATIVE
Ketones, UA: NEGATIVE
Leukocytes,UA: NEGATIVE
Nitrite, UA: NEGATIVE
Protein,UA: NEGATIVE
Specific Gravity, UA: 1.025 (ref 1.005–1.030)
Urobilinogen, Ur: 0.2 mg/dL (ref 0.2–1.0)
pH, UA: 6.5 (ref 5.0–7.5)

## 2020-02-13 LAB — MICROSCOPIC EXAMINATION
Bacteria, UA: NONE SEEN
Epithelial Cells (non renal): NONE SEEN /hpf (ref 0–10)
Renal Epithel, UA: NONE SEEN /hpf
WBC, UA: NONE SEEN /hpf (ref 0–5)

## 2020-02-13 MED ORDER — TAMSULOSIN HCL 0.4 MG PO CAPS: 0.4000 mg | ORAL_CAPSULE | Freq: Every day | ORAL | 3 refills | Status: DC

## 2020-02-13 NOTE — Addendum Note (Signed)
Addended by: Wyline Mood on: 02/13/2020 03:13 PM   Modules accepted: Orders

## 2020-02-13 NOTE — Progress Notes (Signed)
Urological Symptom Review  Patient is experiencing the following symptoms: Get up at night to urinate  Kidney stones   Review of Systems  Gastrointestinal (upper)  : Negative for upper GI symptoms  Gastrointestinal (lower) : Diarrhea Constipation  Constitutional : Negative for symptoms  Skin: Negative for skin symptoms  Eyes: Negative for eye symptoms  Ear/Nose/Throat : Negative for Ear/Nose/Throat symptoms  Hematologic/Lymphatic: Negative for Hematologic/Lymphatic symptoms  Cardiovascular : Negative for cardiovascular symptoms  Respiratory : Negative for respiratory symptoms  Endocrine: Negative for endocrine symptoms  Musculoskeletal: Negative for musculoskeletal symptoms  Neurological: Negative for neurological symptoms  Psychologic: Negative for psychiatric symptoms

## 2020-02-13 NOTE — Progress Notes (Signed)
02/13/2020 3:00 PM   Austin Frost [birth year] [id number]  Referring provider: Josem Kaufmann, MD [address] [place] [address]  nephrolithiasis  HPI: Austin Frost is a 59yo here for followup for nephrolithiasis. He underwent ESWL 2 weeks ago. He passed multiple fragments. No flank pain. He notes his LUTS have significantly improved since starting flomax. UA today shows no RBCs   PMH: Past Medical History:  Diagnosis Date   Asthma    as a kid   Cataract    Diverticulosis    History of kidney stones    Hyperlipidemia    Retinitis pigmentosa     Surgical History: Past Surgical History:  Procedure Laterality Date   CATARACT EXTRACTION     CHOLECYSTECTOMY     CLOSED MANIPULATION SHOULDER Left    COLONOSCOPY  10/09/2019   EXTRACORPOREAL SHOCK WAVE LITHOTRIPSY Left 01/30/2020   Procedure: EXTRACORPOREAL SHOCK WAVE LITHOTRIPSY (ESWL);  Surgeon: Cleon Gustin, MD;  Location: [facility];  Service: Urology;  Laterality: Left;   KNEE ARTHROSCOPY Left     Home Medications:  Allergies as of 02/13/2020   No Known Allergies     Medication List       Accurate as of February 13, 2020  3:00 PM. If you have any questions, ask your nurse or doctor.        Align 4 MG Caps Take 4 mg by mouth daily.   oxyCODONE-acetaminophen 5-325 MG tablet Commonly known as: Percocet Take 1 tablet by mouth every 4 (four) hours as needed for severe pain.   rosuvastatin 10 MG tablet Commonly known as: CRESTOR Take 10 mg by mouth daily.   tamsulosin 0.4 MG Caps capsule Commonly known as: Flomax Take 1 capsule (0.4 mg total) by mouth daily after supper.   Vitamin A 4.5 MG (15000 UT) Tabs Take 15,000 Units by mouth daily.       Allergies: No Known Allergies  Family History: Family History  Problem Relation Age of Onset   Colon cancer Father 60   Esophageal cancer Neg Hx    Stomach cancer Neg Hx    Rectal cancer Neg Hx     Social History:  reports that he has  never smoked. He has never used smokeless tobacco. He reports that he does not drink alcohol and does not use drugs.  ROS: All other review of systems were reviewed and are negative except what is noted above in HPI  Physical Exam: BP 129/83    Pulse 91    Temp 98.4 F (36.9 C)    Ht 5\' 10"  (1.778 m)    Wt 250 lb (113.4 kg)    BMI 35.87 kg/m   Constitutional:  Alert and oriented, No acute distress. HEENT: [place] AT, moist mucus membranes.  Trachea midline, no masses. Cardiovascular: No clubbing, cyanosis, or edema. Respiratory: Normal respiratory effort, no increased work of breathing. GI: Abdomen is soft, nontender, nondistended, no abdominal masses GU: No CVA tenderness.  Lymph: No cervical or inguinal lymphadenopathy. Skin: No rashes, bruises or suspicious lesions. Neurologic: Grossly intact, no focal deficits, moving all 4 extremities. Psychiatric: Normal mood and affect.  Laboratory Data: No results found for: WBC, HGB, HCT, MCV, PLT  Lab Results  Component Value Date   CREATININE 0.79 12/20/2019    No results found for: PSA  No results found for: TESTOSTERONE  No results found for: HGBA1C  Urinalysis    Component Value Date/Time   APPEARANCEUR Clear 01/17/2020 0911   GLUCOSEU Negative  01/17/2020 0911   BILIRUBINUR Negative 01/17/2020 0911   PROTEINUR Negative 01/17/2020 0911   UROBILINOGEN negative (A) 12/20/2019 0852   NITRITE Negative 01/17/2020 0911   LEUKOCYTESUR Negative 01/17/2020 0911    Lab Results  Component Value Date   LABMICR See below: 01/17/2020   WBCUA None seen 01/17/2020   LABEPIT None seen 01/17/2020   MUCUS Present 01/17/2020   BACTERIA None seen 01/17/2020    Pertinent Imaging: KUB 02/09/2020: Images reviewed and discussed with patient Results for orders placed during the hospital encounter of 02/09/20  DG Abd 1 View  Narrative CLINICAL DATA:  Left kidney stone status post lithotripsy.  EXAM: ABDOMEN - 1 VIEW  COMPARISON:   01/30/2020  FINDINGS: No residual radiopaque calculi identified, although overlapping bowel gas limits evaluation of the left kidney. Similar appearance of probable phleboliths within the anatomic pelvis. Nonobstructive bowel gas pattern. No evidence of free air. Moderate amount of stool within the ascending colon. Cholecystectomy clips.  IMPRESSION: 1. No residual radiopaque calculi identified, although overlapping bowel gas limits evaluation of the left kidney. 2. Nonobstructive bowel gas pattern.   Electronically Signed By: Margaretha Sheffield MD On: 02/09/2020 15:43  No results found for this or any previous visit.  No results found for this or any previous visit.  No results found for this or any previous visit.  No results found for this or any previous visit.  No results found for this or any previous visit.  Results for orders placed during the hospital encounter of 01/12/20  CT HEMATURIA WORKUP  Narrative CLINICAL DATA:  Gross hematuria in a 59 year old. History of blood in urine for over a year.  EXAM: CT ABDOMEN AND PELVIS WITHOUT AND WITH CONTRAST  TECHNIQUE: Multidetector CT imaging of the abdomen and pelvis was performed following the standard protocol before and following the bolus administration of intravenous contrast.  CONTRAST:  167mL OMNIPAQUE IOHEXOL 300 MG/ML  SOLN  COMPARISON:  None.  FINDINGS: Lower chest: Subpleural reticulation in the lung bases bilaterally. No consolidation. No pleural effusion.  Hepatobiliary: Geographic hepatic steatosis. Post cholecystectomy. No overt biliary duct dilation.  No suspicious focal lesion. Tiny lesion along the gallbladder fossa likely a small cyst.  Pancreas: Pancreas is normal without ductal dilation or focal lesion. No peripancreatic inflammation.  Spleen: Spleen normal in size and contour.  Adrenals/Urinary Tract: Adrenal glands are normal.  Small cyst in the RIGHT kidney. Nephrolithiasis  on the LEFT with a interpolar calculus measuring 6 x 5 mm. No ureteral calculi. Lower pole cyst on the LEFT as well.  Excretory phase images without signs of stricture. Limited opacification of the distal LEFT ureter. No filling defect in the upper tracts. Limited assessment of the urinary bladder due to under distension.  Stomach/Bowel: Stomach and proximal small bowel are normal.  Mid to distal ileum is adjacent to inflammatory changes involving distal colon. The appendix is also closely associated with these findings of stranding in this area. There is no free air. There is no pneumatosis. There is no abscess.  Segmental colonic thickening is noted in this area, nonspecific appearance.  Only the tip of the appendix is associated with this process but it appears tethered to the colon and small bowel in this location.  Vascular/Lymphatic: Abdominal vasculature is patent without aneurysm or atherosclerotic change. No adenopathy.  No pelvic adenopathy.  Reproductive: Heterogeneous appearance of the prostate. Nonspecific finding on to CT.  Other: No ascitic fluid.  No free air.  No abscess.  Musculoskeletal: No acute  musculoskeletal finding. Spinal degenerative changes.  IMPRESSION: 1. Diverticular disease of the colon but with asymmetric thickening of the colonic wall, adjacent stranding and appearance of adherence to bowel and appendiceal tip. Findings may represent sequela of previous diverticulitis or combination of ongoing inflammation and chronic changes. Given the asymmetric thickening would suggest colonoscopy on follow-up to exclude colonic neoplasm after correlation with any acute symptoms of abdominal pain. 2. Nonobstructive LEFT nephrolithiasis. No sign of upper tract lesion or ureteral calculus. 3. Geographic hepatic steatosis. 4. Heterogeneous appearance of the prostate. Nonspecific finding on CT. 5. Aortic atherosclerosis.  These results will be called  to the ordering clinician or representative by the Radiologist Assistant, and communication documented in the PACS or [employer].  Aortic Atherosclerosis (ICD10-I70.0).   Electronically Signed By: Zetta Bills M.D. On: 01/12/2020 17:43  No results found for this or any previous visit.   Assessment & Plan:    1. Renal stone -RTC 6 months with KUB -Dietary handout given - Urinalysis, Routine w reflex microscopic   No follow-ups on file.  Nicolette Bang, MD  [facility] [place]

## 2020-02-13 NOTE — Progress Notes (Signed)
Letter sent.

## 2020-02-13 NOTE — Patient Instructions (Signed)
Dietary Guidelines to Help Prevent Kidney Stones Kidney stones are deposits of minerals and salts that form inside your kidneys. Your risk of developing kidney stones may be greater depending on your diet, your lifestyle, the medicines you take, and whether you have certain medical conditions. Most people can reduce their chances of developing kidney stones by following the instructions below. Depending on your overall health and the type of kidney stones you tend to develop, your dietitian may give you more specific instructions. What are tips for following this plan? Reading food labels  Choose foods with "no salt added" or "low-salt" labels. Limit your sodium intake to less than 1500 mg per day.  Choose foods with calcium for each meal and snack. Try to eat about 300 mg of calcium at each meal. Foods that contain 200-500 mg of calcium per serving include: ? 8 oz (237 ml) of milk, fortified nondairy milk, and fortified fruit juice. ? 8 oz (237 ml) of kefir, yogurt, and soy yogurt. ? 4 oz (118 ml) of tofu. ? 1 oz of cheese. ? 1 cup (300 g) of dried figs. ? 1 cup (91 g) of cooked broccoli. ? 1-3 oz can of sardines or mackerel.  Most people need 1000 to 1500 mg of calcium each day. Talk to your dietitian about how much calcium is recommended for you. Shopping  Buy plenty of fresh fruits and vegetables. Most people do not need to avoid fruits and vegetables, even if they contain nutrients that may contribute to kidney stones.  When shopping for convenience foods, choose: ? Whole pieces of fruit. ? Premade salads with dressing on the side. ? Low-fat fruit and yogurt smoothies.  Avoid buying frozen meals or prepared deli foods.  Look for foods with live cultures, such as yogurt and kefir. Cooking  Do not add salt to food when cooking. Place a salt shaker on the table and allow each person to add his or her own salt to taste.  Use vegetable protein, such as beans, textured vegetable  protein (TVP), or tofu instead of meat in pasta, casseroles, and soups. Meal planning   Eat less salt, if told by your dietitian. To do this: ? Avoid eating processed or premade food. ? Avoid eating fast food.  Eat less animal protein, including cheese, meat, poultry, or fish, if told by your dietitian. To do this: ? Limit the number of times you have meat, poultry, fish, or cheese each week. Eat a diet free of meat at least 2 days a week. ? Eat only one serving each day of meat, poultry, fish, or seafood. ? When you prepare animal protein, cut pieces into small portion sizes. For most meat and fish, one serving is about the size of one deck of cards.  Eat at least 5 servings of fresh fruits and vegetables each day. To do this: ? Keep fruits and vegetables on hand for snacks. ? Eat 1 piece of fruit or a handful of berries with breakfast. ? Have a salad and fruit at lunch. ? Have two kinds of vegetables at dinner.  Limit foods that are high in a substance called oxalate. These include: ? Spinach. ? Rhubarb. ? Beets. ? Potato chips and french fries. ? Nuts.  If you regularly take a diuretic medicine, make sure to eat at least 1-2 fruits or vegetables high in potassium each day. These include: ? Avocado. ? Banana. ? Orange, prune, carrot, or tomato juice. ? Baked potato. ? Cabbage. ? Beans and split   peas. General instructions   Drink enough fluid to keep your urine clear or pale yellow. This is the most important thing you can do.  Talk to your health care provider and dietitian about taking daily supplements. Depending on your health and the cause of your kidney stones, you may be advised: ? Not to take supplements with vitamin C. ? To take a calcium supplement. ? To take a daily probiotic supplement. ? To take other supplements such as magnesium, fish oil, or vitamin B6.  Take all medicines and supplements as told by your health care provider.  Limit alcohol intake to no  more than 1 drink a day for nonpregnant women and 2 drinks a day for men. One drink equals 12 oz of beer, 5 oz of wine, or 1 oz of hard liquor.  Lose weight if told by your health care provider. Work with your dietitian to find strategies and an eating plan that works best for you. What foods are not recommended? Limit your intake of the following foods, or as told by your dietitian. Talk to your dietitian about specific foods you should avoid based on the type of kidney stones and your overall health. Grains Breads. Bagels. Rolls. Baked goods. Salted crackers. Cereal. Pasta. Vegetables Spinach. Rhubarb. Beets. Canned vegetables. Pickles. Olives. Meats and other protein foods Nuts. Nut butters. Large portions of meat, poultry, or fish. Salted or cured meats. Deli meats. Hot dogs. Sausages. Dairy Cheese. Beverages Regular soft drinks. Regular vegetable juice. Seasonings and other foods Seasoning blends with salt. Salad dressings. Canned soups. Soy sauce. Ketchup. Barbecue sauce. Canned pasta sauce. Casseroles. Pizza. Lasagna. Frozen meals. Potato chips. French fries. Summary  You can reduce your risk of kidney stones by making changes to your diet.  The most important thing you can do is drink enough fluid. You should drink enough fluid to keep your urine clear or pale yellow.  Ask your health care provider or dietitian how much protein from animal sources you should eat each day, and also how much salt and calcium you should have each day. This information is not intended to replace advice given to you by your health care provider. Make sure you discuss any questions you have with your health care provider. Document Revised: 09/28/2018 Document Reviewed: 05/19/2016 Elsevier Patient Education  2020 [employer]  

## 2020-02-18 LAB — CALCULI, WITH PHOTOGRAPH (CLINICAL LAB)
Calcium Oxalate Monohydrate: 100 %
Weight Calculi: 49 mg

## 2020-02-18 LAB — SPECIMEN STATUS REPORT

## 2020-08-14 ENCOUNTER — Ambulatory Visit: Payer: Medicare PPO | Admitting: Urology

## 2020-09-06 ENCOUNTER — Ambulatory Visit (HOSPITAL_COMMUNITY)
Admission: RE | Admit: 2020-09-06 | Discharge: 2020-09-06 | Disposition: A | Discharge status: Home / Self Care | Payer: Medicare PPO | Source: Ambulatory Visit | Attending: Urology | Admitting: Urology

## 2020-09-06 ENCOUNTER — Other Ambulatory Visit: Payer: Self-pay

## 2020-09-06 DIAGNOSIS — N200 Calculus of kidney: Secondary | ICD-10-CM | POA: Insufficient documentation

## 2020-09-16 ENCOUNTER — Encounter: Payer: Self-pay | Admitting: Urology

## 2020-09-16 ENCOUNTER — Ambulatory Visit (INDEPENDENT_AMBULATORY_CARE_PROVIDER_SITE_OTHER): Payer: Medicare PPO | Admitting: Urology

## 2020-09-16 ENCOUNTER — Other Ambulatory Visit: Payer: Self-pay

## 2020-09-16 VITALS — BP 127/88 | HR 87 | Temp 98.0°F | Ht 70.0 in | Wt 261.0 lb

## 2020-09-16 DIAGNOSIS — N200 Calculus of kidney: Secondary | ICD-10-CM

## 2020-09-16 LAB — URINALYSIS, ROUTINE W REFLEX MICROSCOPIC
Bilirubin, UA: NEGATIVE
Glucose, UA: NEGATIVE
Ketones, UA: NEGATIVE
Leukocytes,UA: NEGATIVE
Nitrite, UA: NEGATIVE
Protein,UA: NEGATIVE
Specific Gravity, UA: 1.025 (ref 1.005–1.030)
Urobilinogen, Ur: 0.2 mg/dL (ref 0.2–1.0)
pH, UA: 5.5 (ref 5.0–7.5)

## 2020-09-16 LAB — MICROSCOPIC EXAMINATION
Renal Epithel, UA: NONE SEEN /hpf
WBC, UA: NONE SEEN /hpf (ref 0–5)

## 2020-09-16 MED ORDER — TAMSULOSIN HCL 0.4 MG PO CAPS: 0.4000 mg | ORAL_CAPSULE | Freq: Every day | ORAL | 3 refills | Status: DC

## 2020-09-16 NOTE — Patient Instructions (Signed)
???????? ????? Kidney Stones  ???????? ????? -- ??? ???????, ????????????? ?????????, ??????? ??????????? ?????? ?????. ????? -- ??? ?????? ?????, ?????????????? ????. ???????? ?????? ????? ?????????????? ? ????? ? ????????????? ? ?????? ?????? ????????????? ?????, ??????? ??????, ??????????? ????? ? ??????? ??????? (???????????), ??????? ??????, ? ????? ??????, ?? ??????? ???? ????????? ?? ????????? (??????). ?? ???? ???????? ????? ????? ??? ?????? ?? ????? ??????? ??????? ???? ? ??????????? ????? ????. ???????? ????? ?????????? ??? ?????????? ?????????? ????????? ????????? ? ????. ????? ?????? ????????? ?? ????????? ??? ??????????????, ?? ? ????????? ??????? ??? ?? ???????? ????? ????????????? ???????. ?????? ???????? ??????????? ?????? ? ?????? ????? ???? ??????? ?????????:  ??????????, ??? ??????? ????????? ?????? ???????????? ??????? ????? ??????? ??????????????? ?????? (????????? ???????????????), ??? ???????? ?????????? ?????????? ??????? ? ?????.  ?????? ?????????? ??????? ??????? ? ??????? ?????? (???????????????). ??????? ??????? -- ??? ?????????? ????????, ???????????? ? ????????? ??? ???????????? ? ???? ???????????? ?????????. ??? ?????? ????????? ?? ????????? ? ?????.  ???????? (?????????) ?????? ??? ????? ????????????.  ???????? ???????? ? ???????? (?????????? ??????????????).  ?????????? ????? ???????? ?? ?????? ? ????????????, ????????, ????? ???????? ?? ???????????? ???????. ??? ???????? ????? ?????????????? ???????? ????? ????????? ????? ????????? ???????:  ??????????? ????????? ????? ? ???????.  ??????????? ???????? ??????? ?? ???????????? ???????.  ????????????? ??????????? ????.  ??????????? ???? ? ??????? ??????????? ?????, ???? (??????) ??? ??????.  ?????????? ??? ??? ????????. ?????? ???????? ??? ????????? ?????????? ???????????? ??????? ????? ????:  ???? ? ????, ????? ??? ??????? (???????? ??????). ???? ?????? ???????????????? (???????????) ? ???????  ???????.  ?????? ??? ??????? ?????? ? ??????????????.  ??????????? ??????????????.  ????? ? ???? (?????????).  ???????.  ?????.  ????????? ? ?????. ??? ??? ?????????????? ??? ????????? ????? ???? ??????????????? ? ??????:  ????? ????????? ? ???????????? ????????.  ???????????? ????????????.  ???????? ?????.  ???????? ????. ??? ????? ???? ????????? ?? ? ????? ?????? ????? ?? ????????? ?? ????? ??????????????.  ???????????????? ????????????, ????????, ?? (???????????? ??????????), ??????? ??????? ??????? ??????? ???? ???.  ????????? ???????????? ???????? ?????? ??????? (???????????). ??? ??? ?????? ??????? ???????????? ??????? ??????? ?? ???????, ?????????????? ? ??????? ??????. ???????? ????? ????? ????????? ?? ????????? ?? ????? ??????????????. ????????, ??? ???????????:  ????????? ??????????? ????????, ????? ?????????????? ????????? ?????. ? ????????? ??????? ??? ????? ??????? ???????? ??????????? ? ????? ????????????? ?????????????? ? ????? ?????????? ?? ??????????.  ????????? ?????????????? ????????.  ???????? ?????? ???????, ????? ????????????? ????????? ??????????? ???????? ??????. ?????? ?????????? ??????????? ????????? ??? ???????? ????????? ?????. ??? ????? ????? ?????????????:  ????????? ????????? ???????? ?????? ???????????: ? ??????????????? ???? ????? (?????????????). ? ??????? ???? (?????????????????? ??????-???????? ???????????).  ???????? ?? ???????? ???????? ??????. ??? ????? ????????????? ??? ??????? ??????? ????? ??? ????? ????? ????????? ????????????? ????. ? ???????? ???????? ???????? ?????? ?????????: ????????????? ?????????  ?????????? ?????????????? ? ??????????? ????????????? ???????? ?????? ? ???????????? ? ?????????? ?????? ???????? ?????.  ???????? ? ?????? ???????? ?????, ??????? ?? ??? ???????? ???????? ?????????? ??? ?????????? ??????? ???????? ? ????? ? ??????? ???????????? ?????????. ??? ? ???????  ????? ?????????? ????????, ????? ????  ???? ?????????? ??????-??????? ?????. ??? ????? ????????? ???????? 8-10 ???????? ???? ?????? ????. ??? ??????? ????????? ????? ?????.  ???? ??? ?????????, ???????? ???? ?????? ???????. ??? ????? ????: ? ??????????? ????????????? ? ????? ??????. ? ???????????? ? ???? ???????? ?????????? ??????? ? ??????. ? ??????????? ???????????? ? ???? ????????? ?????, ????????, ???????? ????, ?????, ???? ? ???.  ?????????? ??????????? ?????? ???????? ????? ? ????????? ??????????? ??? ??? ?????. ????? ????????  ????????? ??????? ???? ??? ??????? ????? ??????? ??????. ????????, ??? ??????????? ??????? ??????? ????: ? ????? 24 ???? ????? ????, ??? ?????? ??????. ? ?????  8-12 ?????? ????? ??????????? ????????? ????? ? ?????? 6-12 ??????? ????? ?????.  ?? ?????????? ?????????? ??? ??????? ???????????? ???? ?????? ???, ????? ????????. ??????????? ??????, ??????? ????????????? ??? ??????? ????.  ?? ??????? ??????????? ???????? ?????? ????? ??? ??????. ????????? ???? ??????, ????? ??? ??????? ???? ??? ??????????? ???. ???????????? ??????? ????????? ????? ????? ?????? ???????????? ???????????????? ? ????? ?????? ? ???????.  ????????? ?? ??? ?????? ???????????? ??????????, ??? ??????? ????? ??????? ??????. ??? ?????. ????????, ??? ??????????? ??????????? ?????????????? ??? ???, ????? ?????????, ??? ?????? ?????. ????? ?????????? ???? ????????????? ????? ????????????? ??????????? ??? ?????? ????? ? ??????:  ????? ?????????? ????????, ????? ???? ???? ?????????? ??????-??????? ?????. ??? ????????? ?????? ???????????? ???????????????? ? ??????.  ??????????????? ???????? ????? ? ???????? ????????????? ?????? ????? ? ????????? ?????????, ???????????? ??????? ??????? ????????. ????????, ??? ???????? ????? ? ?????? ??????????? ?????. ???????????? ??????? ?? ???? ???????????? ? ??? ?????? ? ??????.  ????????????? ?????????? ???.   ??? ????? ????? ?????????????? ??????????  [employer] (????????????  ???????? ????): [email or web address]) (???? ????????????? ??????): [email or web address] ?????????? ? ???????? ?????, ????:  ?? ????????? ?? ????, ??????? ???????????? ??? ?? ????? ??????????, ???????? ?? ????? ????????. ?????????? ?????????? ?? ???????, ????:  ? ??? ???????? ????????? ??? ?????.  ? ??? ???????? ??????? ????.  ? ??? ????????? ????? ???? ? ??????? ??????.  ?? ??????? ? ???????.  ?? ?? ?????? ??????????. ??????  ???????? ????? -- ??? ???????, ????????????? ?????????, ??????? ??????????? ?????? ?????.  ???????? ????? ????? ???????? ???????, ?????, ????? ? ????, ???? ? ?????? ? ?????? ?????? ? ??????????????.  ??????? ???????????? ??????? ??????? ?? ???????, ?????????????? ? ??????? ??????. ???????? ????? ????? ????????? ?? ????????? ?? ????? ??????????????.  ??????????? ???????? ?????? ????? ???????????? ????? ???????????? ???????????? ?????????? ????????, ????????????? ????????? ??????? ??????? ? ??????????? ???????? ???. ??? ?????????? ?? ????? ???????? ??????, ??????????????? ????? ??????. ??????????? ???????? ????? ???????????? ??? ??????? ? ????? ??????? ??????. Document Revised: 12/22/2018 Document Reviewed: 12/22/2018 Elsevier Patient Education  [place].

## 2020-09-16 NOTE — Progress Notes (Signed)
09/16/2020 10:29 AM   Austin Frost [birth year] [id number]  Referring provider: Josem Kaufmann, [place] [address]  followup nephrolithiasis  HPI: Austin Frost is a 60yo here for followup for nephrolithiasis. No stone events since last visit. He denies flank pain. No significant LUTS on flomax 0.4mg  daily. No complaints today.   PMH: Past Medical History:  Diagnosis Date  . Asthma    as a kid  . Cataract   . Diverticulosis   . History of kidney stones   . Hyperlipidemia   . Retinitis pigmentosa     Surgical History: Past Surgical History:  Procedure Laterality Date  . CATARACT EXTRACTION    . CHOLECYSTECTOMY    . CLOSED MANIPULATION SHOULDER Left   . COLONOSCOPY  10/09/2019  . EXTRACORPOREAL SHOCK WAVE LITHOTRIPSY Left 01/30/2020   Procedure: EXTRACORPOREAL SHOCK WAVE LITHOTRIPSY (ESWL);  Surgeon: Cleon Gustin, MD;  Location: [facility];  Service: Urology;  Laterality: Left;  . KNEE ARTHROSCOPY Left     Home Medications:  Allergies as of 09/16/2020   No Known Allergies     Medication List       Accurate as of September 16, 2020 10:29 AM. If you have any questions, ask your nurse or doctor.        Align 4 MG Caps Take 4 mg by mouth daily.   oxyCODONE-acetaminophen 5-325 MG tablet Commonly known as: Percocet Take 1 tablet by mouth every 4 (four) hours as needed for severe pain.   rosuvastatin 10 MG tablet Commonly known as: CRESTOR Take 10 mg by mouth daily.   tamsulosin 0.4 MG Caps capsule Commonly known as: Flomax Take 1 capsule (0.4 mg total) by mouth daily after supper.   Vitamin A 4.5 MG (15000 UT) Tabs Take 15,000 Units by mouth daily.       Allergies: No Known Allergies  Family History: Family History  Problem Relation Age of Onset  . Colon cancer Father 3  . Esophageal cancer Neg Hx   . Stomach cancer Neg Hx   . Rectal cancer Neg Hx     Social History:  reports that he has never smoked. He has never used smokeless  tobacco. He reports that he does not drink alcohol and does not use drugs.  ROS: All other review of systems were reviewed and are negative except what is noted above in HPI  Physical Exam: BP 127/88   Pulse 87   Temp 98 F (36.7 C) (Oral)   Ht 5\' 10"  (1.778 m)   Wt 261 lb (118.4 kg)   BMI 37.45 kg/m   Constitutional:  Alert and oriented, No acute distress. HEENT: [place] AT, moist mucus membranes.  Trachea midline, no masses. Cardiovascular: No clubbing, cyanosis, or edema. Respiratory: Normal respiratory effort, no increased work of breathing. GI: Abdomen is soft, nontender, nondistended, no abdominal masses GU: No CVA tenderness.  Lymph: No cervical or inguinal lymphadenopathy. Skin: No rashes, bruises or suspicious lesions. Neurologic: Grossly intact, no focal deficits, moving all 4 extremities. Psychiatric: Normal mood and affect.  Laboratory Data: No results found for: WBC, HGB, HCT, MCV, PLT  Lab Results  Component Value Date   CREATININE 0.79 12/20/2019    No results found for: PSA  No results found for: TESTOSTERONE  No results found for: HGBA1C  Urinalysis    Component Value Date/Time   APPEARANCEUR Clear 02/13/2020 1448   GLUCOSEU Negative 02/13/2020 1448   BILIRUBINUR Negative 02/13/2020 1448   PROTEINUR Negative  02/13/2020 1448   UROBILINOGEN negative (A) 12/20/2019 0852   NITRITE Negative 02/13/2020 1448   LEUKOCYTESUR Negative 02/13/2020 1448    Lab Results  Component Value Date   LABMICR See below: 02/13/2020   WBCUA None seen 02/13/2020   LABEPIT None seen 02/13/2020   MUCUS Present 01/17/2020   BACTERIA None seen 02/13/2020    Pertinent Imaging: KUB 09/09/2020: Images reviewed and discussed with the patient Results for orders placed during the hospital encounter of 09/06/20  Abdomen 1 view (KUB)  Narrative CLINICAL DATA:  Nephrolithiasis status post lithotripsy 6 months prior  EXAM: ABDOMEN - 1 VIEW  COMPARISON:  02/09/2020  abdominal radiograph  FINDINGS: No radiopaque stones overlie the kidneys or expected locations of the ureters or bladder. Stable tiny small round calcifications in the deep pelvis bilaterally compatible with venous phleboliths. No dilated small bowel loops. Moderate colonic stool volume. Cholecystectomy clips are seen in the right upper quadrant of the abdomen. No evidence of pneumatosis or pneumoperitoneum.  IMPRESSION: No radiopaque urolithiasis.   Electronically Signed By: Ilona Sorrel M.D. On: 09/09/2020 10:18  No results found for this or any previous visit.  No results found for this or any previous visit.  No results found for this or any previous visit.  No results found for this or any previous visit.  No results found for this or any previous visit.  Results for orders placed during the hospital encounter of 01/12/20  CT HEMATURIA WORKUP  Narrative CLINICAL DATA:  Gross hematuria in a 60 year old. History of blood in urine for over a year.  EXAM: CT ABDOMEN AND PELVIS WITHOUT AND WITH CONTRAST  TECHNIQUE: Multidetector CT imaging of the abdomen and pelvis was performed following the standard protocol before and following the bolus administration of intravenous contrast.  CONTRAST:  160mL OMNIPAQUE IOHEXOL 300 MG/ML  SOLN  COMPARISON:  None.  FINDINGS: Lower chest: Subpleural reticulation in the lung bases bilaterally. No consolidation. No pleural effusion.  Hepatobiliary: Geographic hepatic steatosis. Post cholecystectomy. No overt biliary duct dilation.  No suspicious focal lesion. Tiny lesion along the gallbladder fossa likely a small cyst.  Pancreas: Pancreas is normal without ductal dilation or focal lesion. No peripancreatic inflammation.  Spleen: Spleen normal in size and contour.  Adrenals/Urinary Tract: Adrenal glands are normal.  Small cyst in the RIGHT kidney. Nephrolithiasis on the LEFT with a interpolar calculus measuring 6 x 5  mm. No ureteral calculi. Lower pole cyst on the LEFT as well.  Excretory phase images without signs of stricture. Limited opacification of the distal LEFT ureter. No filling defect in the upper tracts. Limited assessment of the urinary bladder due to under distension.  Stomach/Bowel: Stomach and proximal small bowel are normal.  Mid to distal ileum is adjacent to inflammatory changes involving distal colon. The appendix is also closely associated with these findings of stranding in this area. There is no free air. There is no pneumatosis. There is no abscess.  Segmental colonic thickening is noted in this area, nonspecific appearance.  Only the tip of the appendix is associated with this process but it appears tethered to the colon and small bowel in this location.  Vascular/Lymphatic: Abdominal vasculature is patent without aneurysm or atherosclerotic change. No adenopathy.  No pelvic adenopathy.  Reproductive: Heterogeneous appearance of the prostate. Nonspecific finding on to CT.  Other: No ascitic fluid.  No free air.  No abscess.  Musculoskeletal: No acute musculoskeletal finding. Spinal degenerative changes.  IMPRESSION: 1. Diverticular disease of the colon but  with asymmetric thickening of the colonic wall, adjacent stranding and appearance of adherence to bowel and appendiceal tip. Findings may represent sequela of previous diverticulitis or combination of ongoing inflammation and chronic changes. Given the asymmetric thickening would suggest colonoscopy on follow-up to exclude colonic neoplasm after correlation with any acute symptoms of abdominal pain. 2. Nonobstructive LEFT nephrolithiasis. No sign of upper tract lesion or ureteral calculus. 3. Geographic hepatic steatosis. 4. Heterogeneous appearance of the prostate. Nonspecific finding on CT. 5. Aortic atherosclerosis.  These results will be called to the ordering clinician or representative by the  Radiologist Assistant, and communication documented in the PACS or [employer].  Aortic Atherosclerosis (ICD10-I70.0).   Electronically Signed By: Zetta Bills M.D. On: 01/12/2020 17:43  No results found for this or any previous visit.   Assessment & Plan:    1. Renal stone -RTC 1 year with KUB  - Urinalysis, Routine w reflex microscopic   Return in about 1 year (around 09/16/2021) for kub.  Nicolette Bang, MD  [facility] [place]

## 2020-09-17 NOTE — Progress Notes (Signed)
Results sent via my chart 

## 2021-09-08 ENCOUNTER — Other Ambulatory Visit: Payer: Self-pay

## 2021-09-08 ENCOUNTER — Ambulatory Visit (HOSPITAL_COMMUNITY)
Admission: RE | Admit: 2021-09-08 | Discharge: 2021-09-08 | Disposition: A | Discharge status: Home / Self Care | Payer: Medicare PPO | Source: Ambulatory Visit | Attending: Urology | Admitting: Urology

## 2021-09-08 DIAGNOSIS — N200 Calculus of kidney: Secondary | ICD-10-CM | POA: Diagnosis present

## 2021-09-17 ENCOUNTER — Other Ambulatory Visit: Payer: Self-pay

## 2021-09-17 ENCOUNTER — Ambulatory Visit: Payer: Medicare PPO | Admitting: Urology

## 2021-09-17 ENCOUNTER — Encounter: Payer: Self-pay | Admitting: Urology

## 2021-09-17 VITALS — BP 135/82 | HR 92

## 2021-09-17 DIAGNOSIS — N401 Enlarged prostate with lower urinary tract symptoms: Secondary | ICD-10-CM

## 2021-09-17 DIAGNOSIS — N138 Other obstructive and reflux uropathy: Secondary | ICD-10-CM

## 2021-09-17 DIAGNOSIS — N200 Calculus of kidney: Secondary | ICD-10-CM | POA: Diagnosis not present

## 2021-09-17 DIAGNOSIS — R351 Nocturia: Secondary | ICD-10-CM | POA: Diagnosis not present

## 2021-09-17 LAB — URINALYSIS, ROUTINE W REFLEX MICROSCOPIC
Bilirubin, UA: NEGATIVE
Glucose, UA: NEGATIVE
Ketones, UA: NEGATIVE
Leukocytes,UA: NEGATIVE
Nitrite, UA: NEGATIVE
Protein,UA: NEGATIVE
Specific Gravity, UA: >1.03 — ABNORMAL HIGH (ref 1.005–1.030)
Urobilinogen, Ur: 0.2 mg/dL (ref 0.2–1.0)
pH, UA: 5.5 (ref 5.0–7.5)

## 2021-09-17 LAB — MICROSCOPIC EXAMINATION
Bacteria, UA: NONE SEEN
Epithelial Cells (non renal): NONE SEEN /hpf (ref 0–10)
WBC, UA: NONE SEEN /hpf (ref 0–5)

## 2021-09-17 NOTE — Progress Notes (Signed)
? ?09/17/2021 ?10:27 AM  ? ?Austin Frost ?05-20-1961 ?[id number] ? ?Referring provider: Josem Kaufmann, MD ?[address] [place] [address] ? ?Followup nephrolithiasis and BPH ? ? ?HPI: ?Austin Frost is a 61yo here for followup for BPH and nephrolithiasis. IPSS 4 QOL 0 on flomax. Urine stream strong. No straining to urinate. Nocturia 1-2x. No stone events since last visit. KUB from 3/20 shows no calculi. He denies any flank pain. No other complaints today ? ? ?PMH: ?Past Medical History:  ?Diagnosis Date  ? Asthma   ? as a kid  ? Cataract   ? Diverticulosis   ? History of kidney stones   ? Hyperlipidemia   ? Retinitis pigmentosa   ? ? ?Surgical History: ?Past Surgical History:  ?Procedure Laterality Date  ? CATARACT EXTRACTION    ? CHOLECYSTECTOMY    ? CLOSED MANIPULATION SHOULDER Left   ? COLONOSCOPY  10/09/2019  ? EXTRACORPOREAL SHOCK WAVE LITHOTRIPSY Left 01/30/2020  ? Procedure: EXTRACORPOREAL SHOCK WAVE LITHOTRIPSY (ESWL);  Surgeon: Cleon Gustin, MD;  Location: [facility];  Service: Urology;  Laterality: Left;  ? KNEE ARTHROSCOPY Left   ? ? ?Home Medications:  ?Allergies as of 09/17/2021   ?No Known Allergies ?  ? ?  ?Medication List  ?  ? ?  ? Accurate as of September 17, 2021 10:27 AM. If you have any questions, ask your nurse or doctor.  ?  ?  ? ?  ? ?Align 4 MG Caps ?Take 4 mg by mouth daily. ?  ?rosuvastatin 10 MG tablet ?Commonly known as: CRESTOR ?Take 10 mg by mouth daily. ?  ?tamsulosin 0.4 MG Caps capsule ?Commonly known as: Flomax ?Take 1 capsule (0.4 mg total) by mouth daily after supper. ?  ?Vitamin A 4.5 MG (15000 UT) Tabs ?Take 15,000 Units by mouth daily. ?  ? ?  ? ? ?Allergies: No Known Allergies ? ?Family History: ?Family History  ?Problem Relation Age of Onset  ? Colon cancer Father 72  ? Esophageal cancer Neg Hx   ? Stomach cancer Neg Hx   ? Rectal cancer Neg Hx   ? ? ?Social History:  reports that he has never smoked. He has never used smokeless tobacco. He reports that he does not drink  alcohol and does not use drugs. ? ?ROS: ?All other review of systems were reviewed and are negative except what is noted above in HPI ? ?Physical Exam: ?BP 135/82   Pulse 92   ?Constitutional:  Alert and oriented, No acute distress. ?HEENT: [place] AT, moist mucus membranes.  Trachea midline, no masses. ?Cardiovascular: No clubbing, cyanosis, or edema. ?Respiratory: Normal respiratory effort, no increased work of breathing. ?GI: Abdomen is soft, nontender, nondistended, no abdominal masses ?GU: No CVA tenderness.  ?Lymph: No cervical or inguinal lymphadenopathy. ?Skin: No rashes, bruises or suspicious lesions. ?Neurologic: Grossly intact, no focal deficits, moving all 4 extremities. ?Psychiatric: Normal mood and affect. ? ?Laboratory Data: ?No results found for: WBC, HGB, HCT, MCV, PLT ? ?Lab Results  ?Component Value Date  ? CREATININE 0.79 12/20/2019  ? ? ?No results found for: PSA ? ?No results found for: TESTOSTERONE ? ?No results found for: HGBA1C ? ?Urinalysis ?   ?Component Value Date/Time  ? APPEARANCEUR Clear 09/16/2020 1024  ? GLUCOSEU Negative 09/16/2020 1024  ? BILIRUBINUR Negative 09/16/2020 1024  ? PROTEINUR Negative 09/16/2020 1024  ? UROBILINOGEN negative (A) 12/20/2019 0630  ? NITRITE Negative 09/16/2020 1024  ? LEUKOCYTESUR Negative 09/16/2020 1024  ? ? ?Lab  Results  ?Component Value Date  ? LABMICR See below: 09/16/2020  ? [place] None seen 09/16/2020  ? LABEPIT 0-10 09/16/2020  ? MUCUS Present 01/17/2020  ? BACTERIA Few 09/16/2020  ? ? ?Pertinent Imaging: ?KUB 09/08/2021: Images reviewed and discussed with the patient  ?Results for orders placed during the hospital encounter of 09/08/21 ? ?Abdomen 1 view (KUB) ? ?Narrative ?CLINICAL DATA:  Nephrolithiasis. ? ?EXAM: ?ABDOMEN - 1 VIEW ? ?COMPARISON:  Abdominal x-ray 09/06/2020. CT abdomen and pelvis ?01/12/2020. ? ?FINDINGS: ?The bowel gas pattern is normal. No suspicious radio-opaque calculi. ?Phleboliths in the pelvis are unchanged. There are surgical  clips in ?the right upper quadrant. There is a large amount of stool ?throughout the colon. ? ?IMPRESSION: ?1. No suspicious calcifications. ?2. Nonobstructive bowel gas pattern. ? ? ?Electronically Signed ?By: Ronney Asters M.D. ?On: 09/10/2021 23:32 ? ?No results found for this or any previous visit. ? ?No results found for this or any previous visit. ? ?No results found for this or any previous visit. ? ?No results found for this or any previous visit. ? ?No results found for this or any previous visit. ? ?Results for orders placed during the hospital encounter of 01/12/20 ? ?CT HEMATURIA WORKUP ? ?Narrative ?CLINICAL DATA:  Gross hematuria in a 61 year old. History of blood ?in urine for over a year. ? ?EXAM: ?CT ABDOMEN AND PELVIS WITHOUT AND WITH CONTRAST ? ?TECHNIQUE: ?Multidetector CT imaging of the abdomen and pelvis was performed ?following the standard protocol before and following the bolus ?administration of intravenous contrast. ? ?CONTRAST:  134m OMNIPAQUE IOHEXOL 300 MG/ML  SOLN ? ?COMPARISON:  None. ? ?FINDINGS: ?Lower chest: Subpleural reticulation in the lung bases bilaterally. ?No consolidation. No pleural effusion. ? ?Hepatobiliary: Geographic hepatic steatosis. Post cholecystectomy. ?No overt biliary duct dilation. ? ?No suspicious focal lesion. Tiny lesion along the gallbladder fossa ?likely a small cyst. ? ?Pancreas: Pancreas is normal without ductal dilation or focal ?lesion. No peripancreatic inflammation. ? ?Spleen: Spleen normal in size and contour. ? ?Adrenals/Urinary Tract: Adrenal glands are normal. ? ?Small cyst in the RIGHT kidney. Nephrolithiasis on the LEFT with a ?interpolar calculus measuring 6 x 5 mm. No ureteral calculi. Lower ?pole cyst on the LEFT as well. ? ?Excretory phase images without signs of stricture. Limited ?opacification of the distal LEFT ureter. No filling defect in the ?upper tracts. Limited assessment of the urinary bladder due to  under ?distension. ? ?Stomach/Bowel: Stomach and proximal small bowel are normal. ? ?Mid to distal ileum is adjacent to inflammatory changes involving ?distal colon. The appendix is also closely associated with these ?findings of stranding in this area. There is no free air. There is ?no pneumatosis. There is no abscess. ? ?Segmental colonic thickening is noted in this area, nonspecific ?appearance. ? ?Only the tip of the appendix is associated with this process but it ?appears tethered to the colon and small bowel in this location. ? ?Vascular/Lymphatic: Abdominal vasculature is patent without aneurysm ?or atherosclerotic change. No adenopathy. ? ?No pelvic adenopathy. ? ?Reproductive: Heterogeneous appearance of the prostate. Nonspecific ?finding on to CT. ? ?Other: No ascitic fluid.  No free air.  No abscess. ? ?Musculoskeletal: No acute musculoskeletal finding. Spinal ?degenerative changes. ? ?IMPRESSION: ?1. Diverticular disease of the colon but with asymmetric thickening ?of the colonic wall, adjacent stranding and appearance of adherence ?to bowel and appendiceal tip. Findings may represent sequela of ?previous diverticulitis or combination of ongoing inflammation and ?chronic changes. Given the asymmetric thickening would suggest ?colonoscopy on  follow-up to exclude colonic neoplasm after ?correlation with any acute symptoms of abdominal pain. ?2. Nonobstructive LEFT nephrolithiasis. No sign of upper tract ?lesion or ureteral calculus. ?3. Geographic hepatic steatosis. ?4. Heterogeneous appearance of the prostate. Nonspecific finding on ?CT. ?5. Aortic atherosclerosis. ? ?These results will be called to the ordering clinician or ?representative by the Radiologist Assistant, and communication ?documented in the PACS or [employer]. ? ?Aortic Atherosclerosis (ICD10-I70.0). ? ? ?Electronically Signed ?By: Zetta Bills M.D. ?On: 01/12/2020 17:43 ? ?No results found for this or any previous  visit. ? ? ?Assessment & Plan:   ? ?1. Renal stone ?-RTC 1 year with KUB ?- Urinalysis, Routine w reflex microscopic ? ?2. BPh with LUTS, nocturia ?-Continue flomax 0.'4mg'$  daily ? ? ?No follow-ups on file. ? ?Nicolette Bang, MD ? ?[facility]

## 2021-09-17 NOTE — Patient Instructions (Signed)
Dietary Guidelines to Help Prevent Kidney Stones Kidney stones are deposits of minerals and salts that form inside your kidneys. Your risk of developing kidney stones may be greater depending on your diet, your lifestyle, the medicines you take, and whether you have certain medical conditions. Most people can lower their chances of developing kidney stones by following the instructions below. Your dietitian may give you more specific instructions depending on your overall health and the type of kidney stones you tend to develop. What are tips for following this plan? Reading food labels  Choose foods with "no salt added" or "low-salt" labels. Limit your salt (sodium) intake to less than 1,500 mg a day. Choose foods with calcium for each meal and snack. Try to eat about 300 mg of calcium at each meal. Foods that contain 200-500 mg of calcium a serving include: 8 oz (237 mL) of milk, calcium-fortifiednon-dairy milk, and calcium-fortifiedfruit juice. Calcium-fortified means that calcium has been added to these drinks. 8 oz (237 mL) of kefir, yogurt, and soy yogurt. 4 oz (114 g) of tofu. 1 oz (28 g) of cheese. 1 cup (150 g) of dried figs. 1 cup (91 g) of cooked broccoli. One 3 oz (85 g) can of sardines or mackerel. Most people need 1,000-1,500 mg of calcium a day. Talk to your dietitian about how much calcium is recommended for you. Shopping Buy plenty of fresh fruits and vegetables. Most people do not need to avoid fruits and vegetables, even if these foods contain nutrients that may contribute to kidney stones. When shopping for convenience foods, choose: Whole pieces of fruit. Pre-made salads with dressing on the side. Low-fat fruit and yogurt smoothies. Avoid buying frozen meals or prepared deli foods. These can be high in sodium. Look for foods with live cultures, such as yogurt and kefir. Choose high-fiber grains, such as whole-wheat breads, oat bran, and wheat cereals. Cooking Do not add  salt to food when cooking. Place a salt shaker on the table and allow each person to add his or her own salt to taste. Use vegetable protein, such as beans, textured vegetable protein (TVP), or tofu, instead of meat in pasta, casseroles, and soups. Meal planning Eat less salt, if told by your dietitian. To do this: Avoid eating processed or pre-made food. Avoid eating fast food. Eat less animal protein, including cheese, meat, poultry, or fish, if told by your dietitian. To do this: Limit the number of times you have meat, poultry, fish, or cheese each week. Eat a diet free of meat at least 2 days a week. Eat only one serving each day of meat, poultry, fish, or seafood. When you prepare animal protein, cut pieces into small portion sizes. For most meat and fish, one serving is about the size of the palm of your hand. Eat at least five servings of fresh fruits and vegetables each day. To do this: Keep fruits and vegetables on hand for snacks. Eat one piece of fruit or a handful of berries with breakfast. Have a salad and fruit at lunch. Have two kinds of vegetables at dinner. Limit foods that are high in a substance called oxalate. These include: Spinach (cooked), rhubarb, beets, sweet potatoes, and Swiss chard. Peanuts. Potato chips, french fries, and baked potatoes with skin on. Nuts and nut products. Chocolate. If you regularly take a diuretic medicine, make sure to eat at least 1 or 2 servings of fruits or vegetables that are high in potassium each day. These include: Avocado. Banana. Orange, prune,   carrot, or tomato juice. Baked potato. Cabbage. Beans and split peas. Lifestyle  Drink enough fluid to keep your urine pale yellow. This is the most important thing you can do. Spread your fluid intake throughout the day. If you drink alcohol: Limit how much you use to: 0-1 drink a day for women who are not pregnant. 0-2 drinks a day for men. Be aware of how much alcohol is in your  drink. In the [place] one drink equals one 12 oz bottle of beer (355 mL), one 5 oz glass of wine (148 mL), or one 1 oz glass of hard liquor (44 mL). Lose weight if told by your health care provider. Work with your dietitian to find an eating plan and weight loss strategies that work best for you. General information Talk to your health care provider and dietitian about taking daily supplements. You may be told the following depending on your health and the cause of your kidney stones: Not to take supplements with vitamin C. To take a calcium supplement. To take a daily probiotic supplement. To take other supplements such as magnesium, fish oil, or vitamin B6. Take over-the-counter and prescription medicines only as told by your health care provider. These include supplements. What foods should I limit? Limit your intake of the following foods, or eat them as told by your dietitian. Vegetables Spinach. Rhubarb. Beets. Canned vegetables. Pickles. Olives. Baked potatoes with skin. Grains Wheat bran. Baked goods. Salted crackers. Cereals high in sugar. Meats and other proteins Nuts. Nut butters. Large portions of meat, poultry, or fish. Salted, precooked, or cured meats, such as sausages, meat loaves, and hot dogs. Dairy Cheese. Beverages Regular soft drinks. Regular vegetable juice. Seasonings and condiments Seasoning blends with salt. Salad dressings. Soy sauce. Ketchup. Barbecue sauce. Other foods Canned soups. Canned pasta sauce. Casseroles. Pizza. Lasagna. Frozen meals. Potato chips. French fries. The items listed above may not be a complete list of foods and beverages you should limit. Contact a dietitian for more information. What foods should I avoid? Talk to your dietitian about specific foods you should avoid based on the type of kidney stones you have and your overall health. Fruits Grapefruit. The item listed above may not be a complete list of foods and beverages you should  avoid. Contact a dietitian for more information. Summary Kidney stones are deposits of minerals and salts that form inside your kidneys. You can lower your risk of kidney stones by making changes to your diet. The most important thing you can do is drink enough fluid. Drink enough fluid to keep your urine pale yellow. Talk to your dietitian about how much calcium you should have each day, and eat less salt and animal protein as told by your dietitian. This information is not intended to replace advice given to you by your health care provider. Make sure you discuss any questions you have with your health care provider. Document Revised: 06/01/2019 Document Reviewed: 06/01/2019 Elsevier Patient Education  2022 Elsevier Inc.  

## 2021-10-30 ENCOUNTER — Other Ambulatory Visit: Payer: Self-pay | Admitting: Urology

## 2021-10-30 DIAGNOSIS — N200 Calculus of kidney: Secondary | ICD-10-CM

## 2022-08-24 ENCOUNTER — Ambulatory Visit (HOSPITAL_COMMUNITY)
Admission: RE | Admit: 2022-08-24 | Discharge: 2022-08-24 | Disposition: A | Discharge status: Home / Self Care | Payer: Medicare PPO | Source: Ambulatory Visit | Attending: Urology | Admitting: Urology

## 2022-08-24 DIAGNOSIS — N200 Calculus of kidney: Secondary | ICD-10-CM | POA: Diagnosis present

## 2022-09-16 ENCOUNTER — Ambulatory Visit: Payer: Medicare PPO | Admitting: Urology

## 2022-09-16 VITALS — BP 134/83 | HR 109

## 2022-09-16 DIAGNOSIS — R351 Nocturia: Secondary | ICD-10-CM | POA: Diagnosis not present

## 2022-09-16 DIAGNOSIS — N401 Enlarged prostate with lower urinary tract symptoms: Secondary | ICD-10-CM

## 2022-09-16 DIAGNOSIS — N200 Calculus of kidney: Secondary | ICD-10-CM

## 2022-09-16 DIAGNOSIS — N138 Other obstructive and reflux uropathy: Secondary | ICD-10-CM

## 2022-09-16 LAB — MICROSCOPIC EXAMINATION: Bacteria, UA: NONE SEEN

## 2022-09-16 LAB — URINALYSIS, ROUTINE W REFLEX MICROSCOPIC
Bilirubin, UA: NEGATIVE
Glucose, UA: NEGATIVE
Leukocytes,UA: NEGATIVE
Nitrite, UA: NEGATIVE
Protein,UA: NEGATIVE
Specific Gravity, UA: 1.025 (ref 1.005–1.030)
Urobilinogen, Ur: 1 mg/dL (ref 0.2–1.0)
pH, UA: 5.5 (ref 5.0–7.5)

## 2022-09-16 MED ORDER — TAMSULOSIN HCL 0.4 MG PO CAPS: 0.4000 mg | ORAL_CAPSULE | Freq: Every day | ORAL | 3 refills | Status: DC

## 2022-09-16 NOTE — Progress Notes (Unsigned)
09/16/2022 9:50 AM   Austin Frost 1961-06-13 [id number]  Referring provider: No referring provider defined for this encounter.  Followup BPH and Nephrolithiasis   HPI: Austin Frost is a 62yo here for followup for nephrolithiasis and BPH. No stone events since last visit. He denies any flank pain. KUb shows no calculi. IPPS 1 QOl 0 on flomax 0.4mg  Nocturia 0-1x. Urine stream strong. No straining to urinate. No other complaints today   PMH: Past Medical History:  Diagnosis Date   Asthma    as a kid   Cataract    Diverticulosis    History of kidney stones    Hyperlipidemia    Retinitis pigmentosa     Surgical History: Past Surgical History:  Procedure Laterality Date   CATARACT EXTRACTION     CHOLECYSTECTOMY     CLOSED MANIPULATION SHOULDER Left    COLONOSCOPY  10/09/2019   EXTRACORPOREAL SHOCK WAVE LITHOTRIPSY Left 01/30/2020   Procedure: EXTRACORPOREAL SHOCK WAVE LITHOTRIPSY (ESWL);  Surgeon: Cleon Gustin, MD;  Location: [facility];  Service: Urology;  Laterality: Left;   KNEE ARTHROSCOPY Left     Home Medications:  Allergies as of 09/16/2022   No Known Allergies      Medication List        Accurate as of September 16, 2022  9:50 AM. If you have any questions, ask your nurse or doctor.          Align 4 MG Caps Take 4 mg by mouth daily.   rosuvastatin 10 MG tablet Commonly known as: CRESTOR Take 10 mg by mouth daily.   tamsulosin 0.4 MG Caps capsule Commonly known as: FLOMAX TAKE 1 CAPSULE (0.4 MG TOTAL) BY MOUTH DAILY AFTER SUPPER.   Vitamin A 4.5 MG (15000 UT) Tabs Take 15,000 Units by mouth daily.        Allergies: No Known Allergies  Family History: Family History  Problem Relation Age of Onset   Colon cancer Father 33   Esophageal cancer Neg Hx    Stomach cancer Neg Hx    Rectal cancer Neg Hx     Social History:  reports that he has never smoked. He has never used smokeless tobacco. He reports that he does not drink alcohol and does  not use drugs.  ROS: All other review of systems were reviewed and are negative except what is noted above in HPI  Physical Exam: BP 134/83   Pulse (!) 109   Constitutional:  Alert and oriented, No acute distress. HEENT: [place] AT, moist mucus membranes.  Trachea midline, no masses. Cardiovascular: No clubbing, cyanosis, or edema. Respiratory: Normal respiratory effort, no increased work of breathing. GI: Abdomen is soft, nontender, nondistended, no abdominal masses GU: No CVA tenderness.  Lymph: No cervical or inguinal lymphadenopathy. Skin: No rashes, bruises or suspicious lesions. Neurologic: Grossly intact, no focal deficits, moving all 4 extremities. Psychiatric: Normal mood and affect.  Laboratory Data: No results found for: "WBC", "HGB", "HCT", "MCV", "PLT"  Lab Results  Component Value Date   CREATININE 0.79 12/20/2019    No results found for: "PSA"  No results found for: "TESTOSTERONE"  No results found for: "HGBA1C"  Urinalysis    Component Value Date/Time   APPEARANCEUR Clear 09/17/2021 0958   GLUCOSEU Negative 09/17/2021 0958   BILIRUBINUR Negative 09/17/2021 0958   PROTEINUR Negative 09/17/2021 0958   UROBILINOGEN negative (A) 12/20/2019 0852   NITRITE Negative 09/17/2021 0958   LEUKOCYTESUR Negative 09/17/2021 0958    Lab Results  Component  Value Date   LABMICR See below: 09/17/2021   WBCUA None seen 09/17/2021   LABEPIT None seen 09/17/2021   MUCUS Present (A) 09/17/2021   BACTERIA None seen 09/17/2021    Pertinent Imaging: KUB 08/24/2022: images reviewed and discussed with the patient  Results for orders placed during the hospital encounter of 08/24/22  Abdomen 1 view (KUB)  Narrative CLINICAL DATA:  Nephrolithiasis.  EXAM: ABDOMEN - 1 VIEW  COMPARISON:  None Available.  FINDINGS: The bowel gas pattern is normal. No radio-opaque calculi or other significant radiographic abnormality are seen.  IMPRESSION: Negative.   Electronically  Signed By: Dorise Bullion III M.D. On: 08/24/2022 07:59  No results found for this or any previous visit.  No results found for this or any previous visit.  No results found for this or any previous visit.  No results found for this or any previous visit.  No valid procedures specified. Results for orders placed during the hospital encounter of 01/12/20  CT HEMATURIA WORKUP  Narrative CLINICAL DATA:  Gross hematuria in a 62 year old. History of blood in urine for over a year.  EXAM: CT ABDOMEN AND PELVIS WITHOUT AND WITH CONTRAST  TECHNIQUE: Multidetector CT imaging of the abdomen and pelvis was performed following the standard protocol before and following the bolus administration of intravenous contrast.  CONTRAST:  111mL OMNIPAQUE IOHEXOL 300 MG/ML  SOLN  COMPARISON:  None.  FINDINGS: Lower chest: Subpleural reticulation in the lung bases bilaterally. No consolidation. No pleural effusion.  Hepatobiliary: Geographic hepatic steatosis. Post cholecystectomy. No overt biliary duct dilation.  No suspicious focal lesion. Tiny lesion along the gallbladder fossa likely a small cyst.  Pancreas: Pancreas is normal without ductal dilation or focal lesion. No peripancreatic inflammation.  Spleen: Spleen normal in size and contour.  Adrenals/Urinary Tract: Adrenal glands are normal.  Small cyst in the RIGHT kidney. Nephrolithiasis on the LEFT with a interpolar calculus measuring 6 x 5 mm. No ureteral calculi. Lower pole cyst on the LEFT as well.  Excretory phase images without signs of stricture. Limited opacification of the distal LEFT ureter. No filling defect in the upper tracts. Limited assessment of the urinary bladder due to under distension.  Stomach/Bowel: Stomach and proximal small bowel are normal.  Mid to distal ileum is adjacent to inflammatory changes involving distal colon. The appendix is also closely associated with these findings of stranding in  this area. There is no free air. There is no pneumatosis. There is no abscess.  Segmental colonic thickening is noted in this area, nonspecific appearance.  Only the tip of the appendix is associated with this process but it appears tethered to the colon and small bowel in this location.  Vascular/Lymphatic: Abdominal vasculature is patent without aneurysm or atherosclerotic change. No adenopathy.  No pelvic adenopathy.  Reproductive: Heterogeneous appearance of the prostate. Nonspecific finding on to CT.  Other: No ascitic fluid.  No free air.  No abscess.  Musculoskeletal: No acute musculoskeletal finding. Spinal degenerative changes.  IMPRESSION: 1. Diverticular disease of the colon but with asymmetric thickening of the colonic wall, adjacent stranding and appearance of adherence to bowel and appendiceal tip. Findings may represent sequela of previous diverticulitis or combination of ongoing inflammation and chronic changes. Given the asymmetric thickening would suggest colonoscopy on follow-up to exclude colonic neoplasm after correlation with any acute symptoms of abdominal pain. 2. Nonobstructive LEFT nephrolithiasis. No sign of upper tract lesion or ureteral calculus. 3. Geographic hepatic steatosis. 4. Heterogeneous appearance of the prostate. Nonspecific  finding on CT. 5. Aortic atherosclerosis.  These results will be called to the ordering clinician or representative by the Radiologist Assistant, and communication documented in the PACS or [employer].  Aortic Atherosclerosis (ICD10-I70.0).   Electronically Signed By: Zetta Bills M.D. On: 01/12/2020 17:43  No results found for this or any previous visit.   Assessment & Plan:    1. Renal stone -followup 1 year with KUB - Urinalysis, Routine w reflex microscopic  2. Benign prostatic hyperplasia with urinary obstruction -continue flomax 0.4mg  daily  3. Nocturia Continue flomax 0.4mg   daily   No follow-ups on file.  Nicolette Bang, MD  [facility] [place]

## 2022-09-17 ENCOUNTER — Encounter: Payer: Self-pay | Admitting: Urology

## 2022-09-17 IMAGING — DX DG ABDOMEN 1V
2 series · 2 of 2 positions shown · non-contrast
Comparison: Abdominal x-ray 09/06/2020. CT abdomen and pelvis
01/12/2020.

CLINICAL DATA: Nephrolithiasis.

EXAM:
ABDOMEN - 1 VIEW

[abdomen kub (1 of 2)]
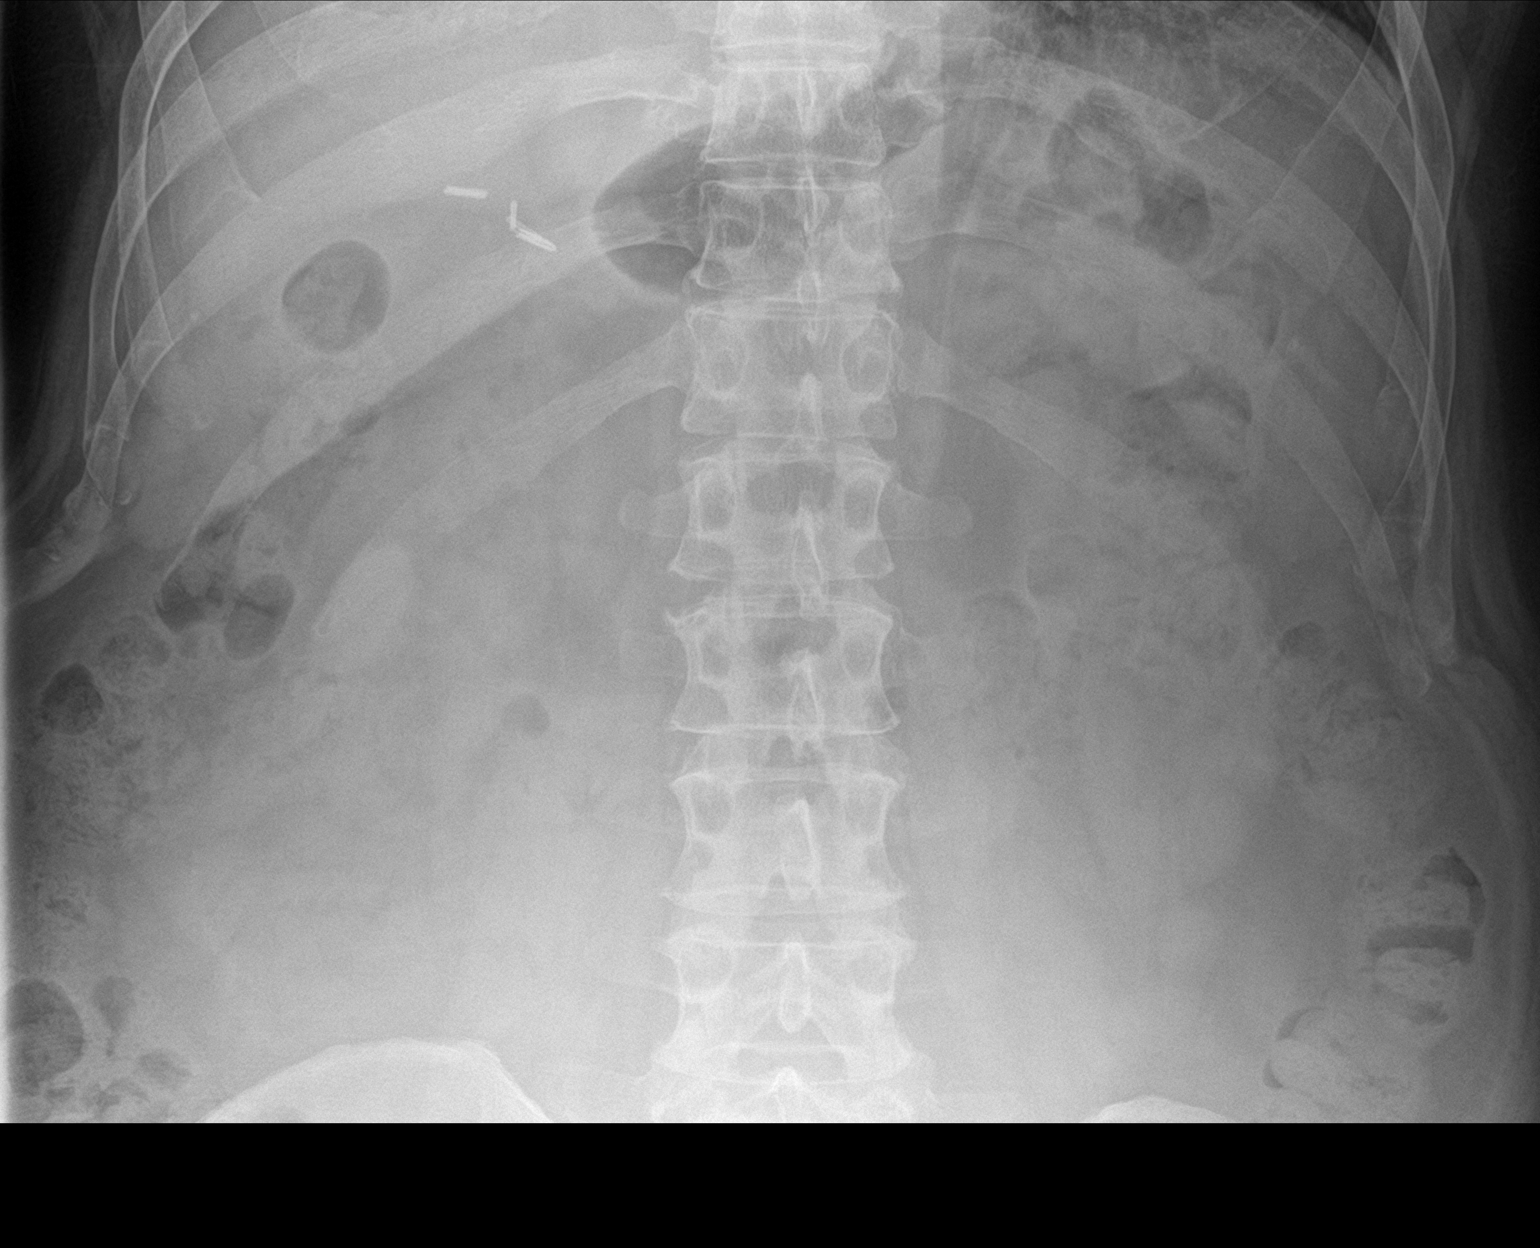

[abdomen kub (2 of 2)]
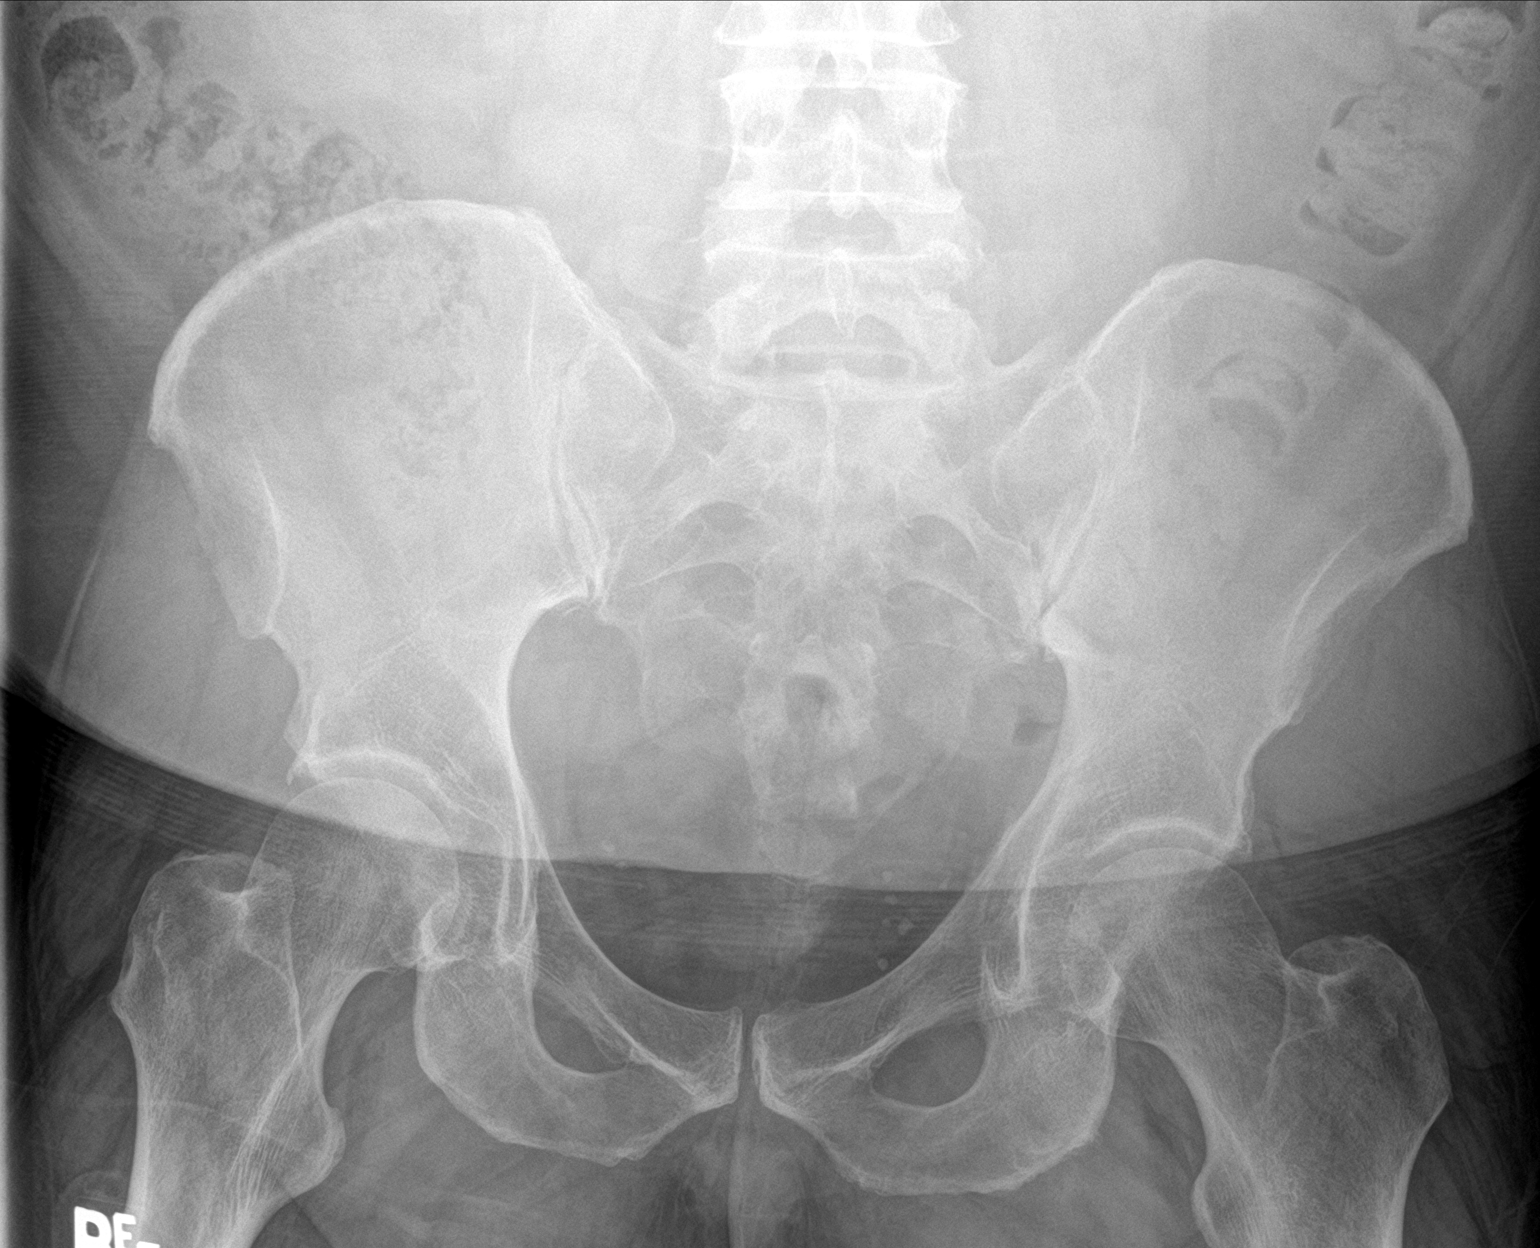

[2 of 2 positions shown; findings below may reference images not displayed]

FINDINGS: The bowel gas pattern is normal. No suspicious radio-opaque calculi.
Phleboliths in the pelvis are unchanged. There are surgical clips in
the right upper quadrant. There is a large amount of stool
throughout the colon.
IMPRESSION: 1. No suspicious calcifications.
2. Nonobstructive bowel gas pattern.

## 2022-09-17 NOTE — Patient Instructions (Signed)

## 2023-09-03 ENCOUNTER — Ambulatory Visit (HOSPITAL_COMMUNITY)
Admission: RE | Admit: 2023-09-03 | Discharge: 2023-09-03 | Disposition: A | Discharge status: Home / Self Care | Source: Ambulatory Visit | Attending: Urology | Admitting: Urology

## 2023-09-03 DIAGNOSIS — N200 Calculus of kidney: Secondary | ICD-10-CM | POA: Insufficient documentation

## 2023-09-15 ENCOUNTER — Encounter: Payer: Self-pay | Admitting: Urology

## 2023-09-15 ENCOUNTER — Ambulatory Visit: Payer: Medicare PPO | Admitting: Urology

## 2023-09-15 VITALS — BP 126/81 | HR 78

## 2023-09-15 DIAGNOSIS — R351 Nocturia: Secondary | ICD-10-CM | POA: Diagnosis not present

## 2023-09-15 DIAGNOSIS — N401 Enlarged prostate with lower urinary tract symptoms: Secondary | ICD-10-CM | POA: Diagnosis not present

## 2023-09-15 DIAGNOSIS — Z87442 Personal history of urinary calculi: Secondary | ICD-10-CM

## 2023-09-15 DIAGNOSIS — N138 Other obstructive and reflux uropathy: Secondary | ICD-10-CM | POA: Diagnosis not present

## 2023-09-15 DIAGNOSIS — N200 Calculus of kidney: Secondary | ICD-10-CM

## 2023-09-15 LAB — URINALYSIS, ROUTINE W REFLEX MICROSCOPIC
Bilirubin, UA: NEGATIVE
Glucose, UA: NEGATIVE
Ketones, UA: NEGATIVE
Leukocytes,UA: NEGATIVE
Nitrite, UA: NEGATIVE
Protein,UA: NEGATIVE
Specific Gravity, UA: 1.03 (ref 1.005–1.030)
Urobilinogen, Ur: 1 mg/dL (ref 0.2–1.0)
pH, UA: 5.5 (ref 5.0–7.5)

## 2023-09-15 LAB — MICROSCOPIC EXAMINATION: Bacteria, UA: NONE SEEN

## 2023-09-15 MED ORDER — TAMSULOSIN HCL 0.4 MG PO CAPS: 0.4000 mg | ORAL_CAPSULE | Freq: Every day | ORAL | 3 refills | Status: AC

## 2023-09-15 NOTE — Patient Instructions (Signed)

## 2023-09-15 NOTE — Progress Notes (Signed)
 09/15/2023 9:18 AM   Austin Frost March 05, 1961 [id number]  Referring provider: No referring provider defined for this encounter.  Followup nephrolithiasis   HPI: Austin Frost is a 62yo here for followup for nephrolithiasis and BPH. No stone events since last visit. KUB from today shows no calculi. He denies nay flank pain. IPSS 3 QOL 0 on flomax 0.4mg  daily. Nocturia 1x. Urine stream strong. No straining to urinate. No urinary hesitancy. NO other complaints today   PMH: Past Medical History:  Diagnosis Date   Asthma    as a kid   Cataract    Diverticulosis    History of kidney stones    Hyperlipidemia    Retinitis pigmentosa     Surgical History: Past Surgical History:  Procedure Laterality Date   CATARACT EXTRACTION     CHOLECYSTECTOMY     CLOSED MANIPULATION SHOULDER Left    COLONOSCOPY  10/09/2019   EXTRACORPOREAL SHOCK WAVE LITHOTRIPSY Left 01/30/2020   Procedure: EXTRACORPOREAL SHOCK WAVE LITHOTRIPSY (ESWL);  Surgeon: Malen Gauze, MD;  Location: [facility];  Service: Urology;  Laterality: Left;   KNEE ARTHROSCOPY Left     Home Medications:  Allergies as of 09/15/2023   No Known Allergies      Medication List        Accurate as of September 15, 2023  9:18 AM. If you have any questions, ask your nurse or doctor.          Align 4 MG Caps Take 4 mg by mouth daily.   rosuvastatin 10 MG tablet Commonly known as: CRESTOR Take 10 mg by mouth daily.   tamsulosin 0.4 MG Caps capsule Commonly known as: FLOMAX Take 1 capsule (0.4 mg total) by mouth daily after supper.   Vitamin A 4.5 MG (15000 UT) Tabs Take 15,000 Units by mouth daily.        Allergies: No Known Allergies  Family History: Family History  Problem Relation Age of Onset   Colon cancer Father 85   Esophageal cancer Neg Hx    Stomach cancer Neg Hx    Rectal cancer Neg Hx     Social History:  reports that he has never smoked. He has never used smokeless tobacco. He reports that he  does not drink alcohol and does not use drugs.  ROS: All other review of systems were reviewed and are negative except what is noted above in HPI  Physical Exam: BP 126/81   Pulse 78   Constitutional:  Alert and oriented, No acute distress. HEENT: [place] AT, moist mucus membranes.  Trachea midline, no masses. Cardiovascular: No clubbing, cyanosis, or edema. Respiratory: Normal respiratory effort, no increased work of breathing. GI: Abdomen is soft, nontender, nondistended, no abdominal masses GU: No CVA tenderness.  Lymph: No cervical or inguinal lymphadenopathy. Skin: No rashes, bruises or suspicious lesions. Neurologic: Grossly intact, no focal deficits, moving all 4 extremities. Psychiatric: Normal mood and affect.  Laboratory Data: No results found for: "WBC", "HGB", "HCT", "MCV", "PLT"  Lab Results  Component Value Date   CREATININE 0.79 12/20/2019    No results found for: "PSA"  No results found for: "TESTOSTERONE"  No results found for: "HGBA1C"  Urinalysis    Component Value Date/Time   APPEARANCEUR Clear 09/16/2022 0916   GLUCOSEU Negative 09/16/2022 0916   BILIRUBINUR Negative 09/16/2022 0916   PROTEINUR Negative 09/16/2022 0916   UROBILINOGEN negative (A) 12/20/2019 0852   NITRITE Negative 09/16/2022 0916   LEUKOCYTESUR Negative 09/16/2022 0916    Lab  Results  Component Value Date   LABMICR See below: 09/16/2022   WBCUA 0-5 09/16/2022   LABEPIT 0-10 09/16/2022   MUCUS Present (A) 09/17/2021   BACTERIA None seen 09/16/2022    Pertinent Imaging: KUB 3/14: Images reviewed and discussed with the patient  Results for orders placed during the hospital encounter of 08/24/22  Abdomen 1 view (KUB)  Narrative CLINICAL DATA:  Nephrolithiasis.  EXAM: ABDOMEN - 1 VIEW  COMPARISON:  None Available.  FINDINGS: The bowel gas pattern is normal. No radio-opaque calculi or other significant radiographic abnormality are  seen.  IMPRESSION: Negative.   Electronically Signed By: Gerome Sam III M.D. On: 08/24/2022 07:59  No results found for this or any previous visit.  No results found for this or any previous visit.  No results found for this or any previous visit.  No results found for this or any previous visit.  No results found for this or any previous visit.  Results for orders placed during the hospital encounter of 01/12/20  CT HEMATURIA WORKUP  Narrative CLINICAL DATA:  Gross hematuria in a 63 year old. History of blood in urine for over a year.  EXAM: CT ABDOMEN AND PELVIS WITHOUT AND WITH CONTRAST  TECHNIQUE: Multidetector CT imaging of the abdomen and pelvis was performed following the standard protocol before and following the bolus administration of intravenous contrast.  CONTRAST:  OMNIPAQUE IOHEXOL 300 MG/ML  SOLN  COMPARISON:  None.  FINDINGS: Lower chest: Subpleural reticulation in the lung bases bilaterally. No consolidation. No pleural effusion.  Hepatobiliary: Geographic hepatic steatosis. Post cholecystectomy. No overt biliary duct dilation.  No suspicious focal lesion. Tiny lesion along the gallbladder fossa likely a small cyst.  Pancreas: Pancreas is normal without ductal dilation or focal lesion. No peripancreatic inflammation.  Spleen: Spleen normal in size and contour.  Adrenals/Urinary Tract: Adrenal glands are normal.  Small cyst in the RIGHT kidney. Nephrolithiasis on the LEFT with a interpolar calculus measuring 6 x 5 mm. No ureteral calculi. Lower pole cyst on the LEFT as well.  Excretory phase images without signs of stricture. Limited opacification of the distal LEFT ureter. No filling defect in the upper tracts. Limited assessment of the urinary bladder due to under distension.  Stomach/Bowel: Stomach and proximal small bowel are normal.  Mid to distal ileum is adjacent to inflammatory changes involving distal colon. The  appendix is also closely associated with these findings of stranding in this area. There is no free air. There is no pneumatosis. There is no abscess.  Segmental colonic thickening is noted in this area, nonspecific appearance.  Only the tip of the appendix is associated with this process but it appears tethered to the colon and small bowel in this location.  Vascular/Lymphatic: Abdominal vasculature is patent without aneurysm or atherosclerotic change. No adenopathy.  No pelvic adenopathy.  Reproductive: Heterogeneous appearance of the prostate. Nonspecific finding on to CT.  Other: No ascitic fluid.  No free air.  No abscess.  Musculoskeletal: No acute musculoskeletal finding. Spinal degenerative changes.  IMPRESSION: 1. Diverticular disease of the colon but with asymmetric thickening of the colonic wall, adjacent stranding and appearance of adherence to bowel and appendiceal tip. Findings may represent sequela of previous diverticulitis or combination of ongoing inflammation and chronic changes. Given the asymmetric thickening would suggest colonoscopy on follow-up to exclude colonic neoplasm after correlation with any acute symptoms of abdominal pain. 2. Nonobstructive LEFT nephrolithiasis. No sign of upper tract lesion or ureteral calculus. 3. Geographic hepatic steatosis.  4. Heterogeneous appearance of the prostate. Nonspecific finding on CT. 5. Aortic atherosclerosis.  These results will be called to the ordering clinician or representative by the Radiologist Assistant, and communication documented in the PACS or [employer].  Aortic Atherosclerosis (ICD10-I70.0).   Electronically Signed By: Donzetta Kohut M.D. On: 01/12/2020 17:43  No results found for this or any previous visit.   Assessment & Plan:    1. Renal stone (Primary) Followup 1 year with a KUB - Urinalysis, Routine w reflex microscopic  2. Benign prostatic hyperplasia with urinary  obstruction Continue flomax 0.4mg  daily  3. Nocturia Continue flomax 0.4mg  daily   No follow-ups on file.  Wilkie Aye, MD  [facility] [place]

## 2024-09-15 ENCOUNTER — Ambulatory Visit: Admitting: Urology
